# Patient Record
Sex: Male | Born: 1995 | Race: Black or African American | Hispanic: No | Marital: Single | Smoking: Never smoker
Health system: Southern US, Community
[De-identification: ages and names within clinical notes are randomized; demographics above are authoritative.]

---

## 2017-07-11 ENCOUNTER — Emergency Department
Admission: EM | Admit: 2017-07-11 | Discharge: 2017-07-11 | Disposition: A | Discharge status: Other Acute Inpatient Hospital | Payer: Federal, State, Local not specified - PPO | Attending: Emergency Medicine | Admitting: Emergency Medicine

## 2017-07-11 ENCOUNTER — Inpatient Hospital Stay
Admission: AD | Admit: 2017-07-11 | Discharge: 2017-07-22 | DRG: 885 | Disposition: A | Discharge status: Home / Self Care | Payer: Federal, State, Local not specified - PPO | Source: Intra-hospital | Attending: Psychiatry | Admitting: Psychiatry

## 2017-07-11 ENCOUNTER — Encounter: Payer: Self-pay | Admitting: Emergency Medicine

## 2017-07-11 ENCOUNTER — Other Ambulatory Visit: Payer: Self-pay

## 2017-07-11 DIAGNOSIS — G4700 Insomnia, unspecified: Secondary | ICD-10-CM | POA: Insufficient documentation

## 2017-07-11 DIAGNOSIS — F309 Manic episode, unspecified: Secondary | ICD-10-CM | POA: Diagnosis present

## 2017-07-11 DIAGNOSIS — M25531 Pain in right wrist: Secondary | ICD-10-CM | POA: Diagnosis not present

## 2017-07-11 DIAGNOSIS — T148XXA Other injury of unspecified body region, initial encounter: Secondary | ICD-10-CM

## 2017-07-11 DIAGNOSIS — F3112 Bipolar disorder, current episode manic without psychotic features, moderate: Secondary | ICD-10-CM

## 2017-07-11 LAB — COMPREHENSIVE METABOLIC PANEL
ALBUMIN: 4.8 g/dL (ref 3.5–5.0)
ALK PHOS: 94 U/L (ref 38–126)
ALT: 39 U/L (ref 17–63)
ANION GAP: 10 (ref 5–15)
AST: 31 U/L (ref 15–41)
BILIRUBIN TOTAL: 1.1 mg/dL (ref 0.3–1.2)
BUN: 17 mg/dL (ref 6–20)
CALCIUM: 9.7 mg/dL (ref 8.9–10.3)
CO2: 25 mmol/L (ref 22–32)
Chloride: 104 mmol/L (ref 101–111)
Creatinine, Ser: 1.15 mg/dL (ref 0.61–1.24)
Glucose, Bld: 103 mg/dL — ABNORMAL HIGH (ref 65–99)
POTASSIUM: 4 mmol/L (ref 3.5–5.1)
Sodium: 139 mmol/L (ref 135–145)
TOTAL PROTEIN: 8.9 g/dL — AB (ref 6.5–8.1)

## 2017-07-11 LAB — CBC
HCT: 45.3 % (ref 40.0–52.0)
HEMOGLOBIN: 15 g/dL (ref 13.0–18.0)
MCH: 30.1 pg (ref 26.0–34.0)
MCHC: 33.1 g/dL (ref 32.0–36.0)
MCV: 90.8 fL (ref 80.0–100.0)
PLATELETS: 352 10*3/uL (ref 150–440)
RBC: 4.99 MIL/uL (ref 4.40–5.90)
RDW: 13.1 % (ref 11.5–14.5)
WBC: 9.6 10*3/uL (ref 3.8–10.6)

## 2017-07-11 LAB — ETHANOL

## 2017-07-11 MED ORDER — MAGNESIUM HYDROXIDE 400 MG/5ML PO SUSP: 30.0000 mL | Freq: Every day | ORAL | Status: DC | PRN

## 2017-07-11 MED ORDER — LORAZEPAM 2 MG PO TABS: 2.0000 mg | ORAL_TABLET | ORAL | Status: DC | PRN

## 2017-07-11 MED ORDER — ACETAMINOPHEN 325 MG PO TABS
650.0000 mg | ORAL_TABLET | Freq: Four times a day (QID) | ORAL | Status: DC | PRN
  Filled 2017-07-11 (×3): qty 2

## 2017-07-11 MED ORDER — LORAZEPAM 2 MG PO TABS
2.0000 mg | ORAL_TABLET | ORAL | Status: DC | PRN
  Administered 2017-07-11: 2 mg via ORAL
  Filled 2017-07-11: qty 1

## 2017-07-11 MED ORDER — ALUM & MAG HYDROXIDE-SIMETH 200-200-20 MG/5ML PO SUSP: 30.0000 mL | ORAL | Status: DC | PRN

## 2017-07-11 MED ORDER — LORAZEPAM 2 MG PO TABS: 2.0000 mg | ORAL_TABLET | Freq: Once | ORAL | Status: DC

## 2017-07-11 NOTE — Progress Notes (Signed)
Admitted patient to the unit alert and oriented to self and environment denies any pain at this time. Patient appeared dilutional  of grandeur and  obsurd exaggeration of self,  states he will be a billionaire in a week , disconnect from reality sometimes, refused to sign treatment agreement stating that he can right his own agreement and refused to contract for  Safety. Patient is placed in his room and monitored by staff and care giver including safety officers noted.Room and body search done and no weapon found. Skin assessment conducted by two RNs Vanice SarahAbi and [place]. Noted.

## 2017-07-11 NOTE — ED Notes (Signed)
PT  VOL °

## 2017-07-11 NOTE — BH Assessment (Signed)
[facility] by Dr. Toni Amendlapacs.  Attending Physician will be Dr. Mikey CollegeMcKnew.   Patient has been assigned to room 306, by [facility] Charge Nurse Abi.   Intake Paper Work has been signed and placed on patient chart.  ER staff is aware of the admission (Dr. Pershing ProudSchaevitz, ER MD; Kimberlee NearingAndrea B., Patient's Nurse & Mertie ClauseJeanelle, Patient Access).

## 2017-07-11 NOTE — ED Notes (Signed)
Pt stated he wanted to wear his underwear on the outside of his outfit to make light of the situation. Pt turned shirt backwards and inside out as well. Asked if he could take stress ball with him. Nurse Amy and Dr Pershing ProudSchaevitz made aware.

## 2017-07-11 NOTE — ED Notes (Signed)
Pt resting on stretcher with lights out to enhance rest. Pt states he is getting very tired and doesn't sleep very often. Asking when he will be admitted downstairs so he can get some sleep.

## 2017-07-11 NOTE — ED Notes (Addendum)
Melvin Drake: Software engineercholarship Director at [employer] would like to be included in password to check on pt. His cell:  [phone]; Pt's cell phone, cross necklace, bookbag with laptop and toiletries sent home with [name]. Pt aware.

## 2017-07-11 NOTE — ED Notes (Signed)
Mom was called by pt prior to his arrival to ED and is aware he is here. She will be wanting an update from pts nurse as soon as possible per pts [name] mentor/scholarship director who dropped him off at ED.

## 2017-07-11 NOTE — ED Notes (Signed)
Unable to give urine specimen at this time .  

## 2017-07-11 NOTE — Tx Team (Signed)
Initial Treatment Plan 07/11/2017 10:45 PM Melvin Drake [id number]    PATIENT STRESSORS: Health problems Substance abuse   PATIENT STRENGTHS: Average or above average intelligence Capable of independent living Religious Affiliation   PATIENT IDENTIFIED PROBLEMS: Manic behavior    Lack of Sleep last three days    Drug use             DISCHARGE CRITERIA:  Ability to meet basic life and health needs Improved stabilization in mood, thinking, and/or behavior Verbal commitment to aftercare and medication compliance  PRELIMINARY DISCHARGE PLAN: Attend aftercare/continuing care group Outpatient therapy Return to previous work or school arrangements  PATIENT/FAMILY INVOLVEMENT: This treatment plan has been presented to and reviewed with the patient, Melvin Drake,   The patient  have been given the opportunity to ask questions and make suggestions.  Lelan PonsAlexander  Ashawnti Tangen, RN 07/11/2017, 10:45 PM

## 2017-07-11 NOTE — ED Notes (Signed)

## 2017-07-11 NOTE — ED Triage Notes (Signed)
Pt here with c/o manic behavior, talking a lot in triage, denies si/hi. States he has recently come out of a relationship and needs to be here.

## 2017-07-11 NOTE — ED Notes (Signed)
Meal provided 

## 2017-07-11 NOTE — ED Triage Notes (Signed)
Pt paranoid about us taking his phone, his counselor from [employer] states this is bizarre behavior for pt, he his well known at [employer] and a strong Consulting civil engineerstudent. Pt states his relationship with his girlfriend "messed him up," and now he is bisexual. Pt also states he is a strong black male and doesn't like being in the minority at school. States he just needed to get away from everything for a while and needs a mental health break.

## 2017-07-11 NOTE — ED Notes (Signed)
Mom: Melvin Drake Mom's cell [phone]; home phone [phone]

## 2017-07-11 NOTE — Consult Note (Signed)
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 Bipolar 1 disorder, manic, moderate ([place]) [F31.12] 07/11/2017    Total Time spent with patient: 1 hour  Subjective:   Melvin Drake is a 21 y.o. male patient admitted with "I know I am having manic feelings".  HPI: Patient interviewed chart reviewed.  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  He states that he has been having what he can identify as a manic episode that has been escalating over the last 3 weeks.  He says he has hardly slept for the last week and a half.  His thoughts are racing all the time.  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  Patient is aware that all of this is out of the ordinary for him.  He denies that he has been having hallucinations.  Denies suicidal or homicidal thoughts.  He is not on any medication and not currently receiving any treatment for mental health problems.  Major stresses include a breakup with a girlfriend early in this part of the semester and possibly more significantly that his father was recently arrested and put back into incarceration after having been free.  Medical history: No significant medical problems known.  Substance abuse history: Patient says that he does not drink alcohol does not use any kind of drugs.  No evidence so far that he does I do not think all of the tests are back yet.  Social history: Patient is from [place].  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  Apparently at baseline very well thought of.  He lists several extracurricular  activities he takes part in and he is a residence Actor.  Past Psychiatric History: Patient had a psychiatric hospitalization when he was a sophomore in high school 4 years ago.  Was diagnosed with bipolar disorder at that time and was treated with Risperdal.  Stayed on medicine for about 8 months before stopping it.  Has not been back on any psychiatric medicines since then.  No history of suicide attempts or violence.  Risk to Self: Suicidal Ideation: No Suicidal Intent: No Is patient at risk for suicide?: No Suicidal Plan?: No Access to Means: No What has been your use of drugs/alcohol within the last 12 months?: Reports of none Other Self Harm Risks: Reports of none Triggers for Past Attempts: None known Intentional Self Injurious Behavior: None Risk to Others: Homicidal Ideation: No Thoughts of Harm to Others: No Current Homicidal Intent: No Current Homicidal Plan: No Access to Homicidal Means: No Identified Victim: Reports of none History of harm to others?: No Assessment of Violence: None Noted Violent Behavior Description: Reports of none Does patient have access to weapons?: No Criminal Charges Pending?: No Does patient have a court date: No Prior Inpatient Therapy: Prior Inpatient Therapy: Yes Prior Therapy Dates: While he was in [employer] Prior Therapy Facilty/Provider(s): Hospital in [place], where he is from. Reason for Treatment: Bipolar, Manic Prior Outpatient Therapy: Prior Outpatient Therapy: Yes Prior Therapy Dates: Current Prior Therapy Facilty/Provider(s): [facility] Reason for Treatment: Unknown Does patient have an ACCT  team?: No Does patient have Intensive In-House Services?  : No Does patient have [facility] services? : No Does patient have P4CC services?: No  Past Medical History: No past medical history on file. History reviewed. No pertinent surgical history. Family History: No family history on  file. Family Psychiatric  History: Family history is positive for bipolar disorder and several people most notably his father. Social History:  Social History   Substance and Sexual Activity  Alcohol Use Yes   Comment: occas.     Social History   Substance and Sexual Activity  Drug Use Yes  . Types: Marijuana   Comment: not recently-for about 3 weeks now    Social History   Socioeconomic History  . Marital status: Single    Spouse name: None  . Number of children: None  . Years of education: None  . Highest education level: None  Social Needs  . Financial resource strain: None  . Food insecurity - worry: None  . Food insecurity - inability: None  . Transportation needs - medical: None  . Transportation needs - non-medical: None  Occupational History  . None  Tobacco Use  . Smoking status: Never Smoker  . Smokeless tobacco: Never Used  Substance and Sexual Activity  . Alcohol use: Yes    Comment: occas.  . Drug use: Yes    Types: Marijuana    Comment: not recently-for about 3 weeks now  . Sexual activity: None  Other Topics Concern  . None  Social History Narrative  . None   Additional Social History:    Allergies:  Allergies no known allergies  Labs:  Results for orders placed or performed during the hospital encounter of 07/11/17 (from the past 48 hour(s))  Comprehensive metabolic panel     Status: Abnormal   Collection Time: 07/11/17  5:02 PM  Result Value Ref Range   Sodium 139 135 - 145 mmol/L   Potassium 4.0 3.5 - 5.1 mmol/L   Chloride 104 101 - 111 mmol/L   CO2 25 22 - 32 mmol/L   Glucose, Bld 103 (H) 65 - 99 mg/dL   BUN 17 6 - 20 mg/dL   Creatinine, Ser 1.15 0.61 - 1.24 mg/dL   Calcium 9.7 8.9 - 10.3 mg/dL   Total Protein 8.9 (H) 6.5 - 8.1 g/dL   Albumin 4.8 3.5 - 5.0 g/dL   AST 31 15 - 41 U/L   ALT 39 17 - 63 U/L   Alkaline Phosphatase 94 38 - 126 U/L   Total Bilirubin 1.1 0.3 - 1.2 mg/dL   GFR calc non Af Amer >60 >60 mL/min   GFR calc  Af Amer >60 >60 mL/min    Comment: (NOTE) The eGFR has been calculated using the CKD EPI equation. This calculation has not been validated in all clinical situations. eGFR's persistently <60 mL/min signify possible Chronic Kidney Disease.    Anion gap 10 5 - 15  Ethanol     Status: None   Collection Time: 07/11/17  5:02 PM  Result Value Ref Range   Alcohol, Ethyl (B) <10 <10 mg/dL    Comment:        LOWEST DETECTABLE LIMIT FOR SERUM ALCOHOL IS 10 mg/dL FOR MEDICAL PURPOSES ONLY   cbc     Status: None   Collection Time: 07/11/17  5:02 PM  Result Value Ref Range   WBC 9.6 3.8 - 10.6 K/uL   RBC 4.99 4.40 - 5.90 MIL/uL   Hemoglobin 15.0 13.0 -  18.0 g/dL   HCT 45.3 40.0 - 52.0 %   MCV 90.8 80.0 - 100.0 fL   MCH 30.1 26.0 - 34.0 pg   MCHC 33.1 32.0 - 36.0 g/dL   RDW 13.1 11.5 - 14.5 %   Platelets 352 150 - 440 K/uL    Current Facility-Administered Medications  Medication Dose Route Frequency Provider Last Rate Last Dose  . LORazepam (ATIVAN) tablet 2 mg  2 mg Oral Once Orbie Pyo, MD       No current outpatient medications on file.    Musculoskeletal: Strength & Muscle Tone: within normal limits Gait & Station: normal Patient leans: N/A  Psychiatric Specialty Exam: Physical Exam  Nursing note and vitals reviewed. Constitutional: He appears well-developed and well-nourished.  HENT:  Head: Normocephalic and atraumatic.  Eyes: Conjunctivae are normal. Pupils are equal, round, and reactive to light.  Neck: Normal range of motion.  Cardiovascular: Regular rhythm and normal heart sounds.  Respiratory: Effort normal and breath sounds normal.  GI: Soft.  Musculoskeletal: Normal range of motion.  Neurological: He is alert.  Skin: Skin is warm and dry.  Psychiatric: His affect is labile. His speech is rapid and/or pressured and tangential. He is agitated and hyperactive. He is not aggressive and not combative. Thought content is not paranoid. Cognition and  memory are impaired. He expresses impulsivity. He expresses no homicidal and no suicidal ideation.    Review of Systems  Constitutional: Negative.   HENT: Negative.   Eyes: Negative.   Respiratory: Negative.   Cardiovascular: Negative.   Gastrointestinal: Negative.   Musculoskeletal: Negative.   Skin: Negative.   Neurological: Negative.   Psychiatric/Behavioral: Negative for depression, hallucinations, memory loss, substance abuse and suicidal ideas. The patient is nervous/anxious and has insomnia.     Pulse (!) 109, temperature 98.4 F (36.9 C), temperature source Oral, resp. rate 18, height 5' 7"  (1.702 m), weight 81.6 kg (180 lb), SpO2 98 %.Body mass index is 28.19 kg/m.  General Appearance: Fairly Groomed  Eye Contact:  Good  Speech:  Clear and Coherent  Volume:  Increased  Mood:  Euphoric  Affect:  Congruent  Thought Process:  Disorganized  Orientation:  Full (Time, Place, and Person)  Thought Content:  Illogical and Tangential  Suicidal Thoughts:  No  Homicidal Thoughts:  No  Memory:  Immediate;   Good Recent;   Fair Remote;   Fair  Judgement:  Fair  Insight:  Fair  Psychomotor Activity:  Increased and Restlessness  Concentration:  Concentration: Fair  Recall:  [employer] of Knowledge:  Fair  Language:  Fair  Akathisia:  No  Handed:  Right  AIMS (if indicated):     Assets:  Communication Skills Desire for Improvement Financial Resources/Insurance Housing Physical Health Resilience Social Support  ADL's:  Intact  Cognition:  WNL  Sleep:        Treatment Plan Summary: Daily contact with patient to assess and evaluate symptoms and progress in treatment, Medication management and Plan 21 year old man who clearly he is having a manic episode.  Not yet obviously psychotic but verging into it and getting more disorganized.  Not functioning well very poor self-care currently outside the hospital.  This is a condition that is potentially dangerous and requires  acute treatment in the hospital.  I had a talk with the patient explaining the importance of inpatient treatment.  He is agreeable to this.  It is very important to him that he maintain control over the situation and  so he says he will sign voluntarily rather than undergo commitment.  I am willing to go with that as long as he does not go back on his decision to sign in.  He also insists that he does not want to take any medicine.  I explained to him that just to start with he needs to get some sleep and so we really ought to give him something that will help him rest tonight.  He is tentatively agreeable to it.  Case reviewed with emergency room physician.  I will order EKG and full set of labs and get orders done for admission downstairs.  Disposition: Recommend psychiatric Inpatient admission when medically cleared. Supportive therapy provided about ongoing stressors.  Alethia Berthold, MD 07/11/2017 7:22 PM

## 2017-07-11 NOTE — BH Assessment (Signed)
Assessment Note  Melvin Drake is an 21 y.o. male who presents to the ER via advisor [facility] [employer]. Per the report of the advisor Melvin Drake(Melvin Drake), he has received multiple calls from other students about the recent changes in his behavior. The change occurred approximately a week ago. He has been manic, religious preoccupied and talking about being a prophet and special messenger for God. 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  During the interview, the patient was hyper-verbal and difficult to remain focus on one thing, in order to answer questions.  He shared he's been inpatient in the past but it's unclear when and for what. Per the report of [name], it was due to similar presentation and it was when he was in [employer]. Patient denies the use of any mind-altering substance. He denies the any involvement with the legal system and with DSS. Patient denies SI/HI and AV/H.  [facility]   Mother Melvin Drake(Melvin Drake [phone]).  Diagnosis: Bipolar  Past Medical History: No past medical history on file.  History reviewed. No pertinent surgical history.  Family History: No family history on file.  Social History:  reports that  has never smoked. he has never used smokeless tobacco. He reports that he drinks alcohol. He reports that he uses drugs. Drug: Marijuana.  Additional Social History:  Alcohol / Drug Use Pain Medications: See PTA Prescriptions: See PTA Over the Counter: See PTA History of alcohol / drug use?: No history of alcohol / drug abuse Longest period of sobriety (when/how long): Unknown  CIWA: CIWA-Ar Pulse Rate: (!) 109 COWS:    Allergies: Allergies no known allergies  Home Medications:  (Not in a hospital admission)  OB/GYN Status:  No LMP for male patient.  General Assessment Data Assessment unable to be  completed: Yes [facility] Assessment: In system Is this a Tele or Face-to-Face Assessment?: Face-to-Face Is this an Initial Assessment or a Re-assessment for this encounter?: Initial Assessment Marital status: Single Maiden name: n/a Is patient pregnant?: No Pregnancy Status: No Living Arrangements: Other (Comment)(Live on campus) Can pt return to current living arrangement?: Yes Admission Status: Voluntary Is patient capable of signing voluntary admission?: Yes Referral Source: Self/Family/Friend Insurance type: unknown  Medical Screening Exam [facility] Walk[facility]in ONLY) Medical Exam completed: Yes  Crisis Care Plan Living Arrangements: Other (Comment)(Live on campus) Legal Guardian: Other:(Self) Name of Psychiatrist: Reports of none [facility] Therapist[facility]  Education Status Is patient currently in school?: Yes Current Grade: Second Year in [employer] Highest grade of school patient has completed: [employer] Name of school: [employer] Contact person: n/a  Risk to self with the past 6 months Suicidal Ideation: No Has patient been a risk to self within the past 6 months prior to admission? : No Suicidal Intent: No Has patient had any suicidal intent within the past 6 months prior to admission? : No Is patient at risk for suicide?: No Suicidal Plan?: No Has patient had any suicidal plan within the past 6 months prior to admission? : No Access to Means: No What has been your use of drugs/alcohol within the last 12 months?: Reports of none Previous Attempts/Gestures: No Other Self Harm Risks: Reports of none Triggers for Past Attempts: None known Intentional Self Injurious Behavior: None Family Suicide History: No Recent stressful life event(s): Other (Comment)(Recent changes in mental state) Persecutory voices/beliefs?: No  Depression: Yes Depression Symptoms: Loss of interest in usual pleasures Substance abuse history and/or  treatment for substance abuse?: No Suicide prevention information given to non-admitted patients: Not applicable  Risk to Others within the past 6 months Homicidal Ideation: No Does patient have any lifetime risk of violence toward others beyond the six months prior to admission? : No Thoughts of Harm to Others: No Current Homicidal Intent: No Current Homicidal Plan: No Access to Homicidal Means: No Identified Victim: Reports of none History of harm to others?: No Assessment of Violence: None Noted Violent Behavior Description: Reports of none Does patient have access to weapons?: No Criminal Charges Pending?: No Does patient have a court date: No Is patient on probation?: No  Psychosis Hallucinations: None noted Delusions: Grandiose(Religious preoccupied)  Mental Status Report Appearance/Hygiene: Unremarkable, In scrubs Eye Contact: Good Motor Activity: Freedom of movement, Unremarkable Speech: Logical/coherent, Tangential, Rapid Level of Consciousness: Alert Mood: Preoccupied Affect: Appropriate to circumstance Anxiety Level: Minimal Thought Processes: Coherent, Relevant Judgement: Partial Orientation: Person, Place, Time, Situation, Appropriate for developmental age Obsessive Compulsive Thoughts/Behaviors: Minimal  Cognitive Functioning Concentration: Decreased Memory: Remote Intact, Recent Impaired IQ: Average Insight: Fair Impulse Control: Fair Appetite: Fair Weight Loss: 0 Weight Gain: 0 Sleep: Decreased Total Hours of Sleep: (unknown) Vegetative Symptoms: None  ADLScreening [facility] Assessment Services) Patient's cognitive ability adequate to safely complete daily activities?: Yes Patient able to express need for assistance with ADLs?: Yes Independently performs ADLs?: Yes (appropriate for developmental age)  Prior Inpatient Therapy Prior Inpatient Therapy: Yes Prior Therapy Dates: While he was in [employer] Prior Therapy Facilty/Provider(s): Hospital in  [place], where he is from. Reason for Treatment: Bipolar, Manic  Prior Outpatient Therapy Prior Outpatient Therapy: Yes Prior Therapy Dates: Current Prior Therapy Facilty/Provider(s): [name] [facility] Reason for Treatment: Unknown Does patient have an ACCT team?: No Does patient have Intensive In-House Services?  : No Does patient have [facility] services? : No Does patient have P4CC services?: No  ADL Screening (condition at time of admission) Patient's cognitive ability adequate to safely complete daily activities?: Yes Is the patient deaf or have difficulty hearing?: No Does the patient have difficulty seeing, even when wearing glasses/contacts?: No Does the patient have difficulty concentrating, remembering, or making decisions?: No Patient able to express need for assistance with ADLs?: Yes Does the patient have difficulty dressing or bathing?: No Independently performs ADLs?: Yes (appropriate for developmental age) Does the patient have difficulty walking or climbing stairs?: No Weakness of Legs: None Weakness of Arms/Hands: None  Home Assistive Devices/Equipment Home Assistive Devices/Equipment: None  Therapy Consults (therapy consults require a physician order) PT Evaluation Needed: No OT Evalulation Needed: No SLP Evaluation Needed: No   Values / Beliefs Cultural Requests During Hospitalization: None Spiritual Requests During Hospitalization: None Consults Spiritual Care Consult Needed: No Social Work Consult Needed: No Merchant navy officer (For Healthcare) Does Patient Have a Medical Advance Directive?: No    Additional Information 1:1 In Past 12 Months?: No CIRT Risk: No Elopement Risk: No Does patient have medical clearance?: Yes  Child/Adolescent Assessment Running Away Risk: Denies(Patient is an adult)  Disposition:  Disposition Initial Assessment Completed for this Encounter: Yes Disposition of Patient:  Pending Review with psychiatrist  On Site Evaluation by:   Reviewed with Physician:    Lilyan Gilford MS, LCAS, LPC, NCC, CCSI Therapeutic Triage Specialist 07/11/2017 6:22 PM

## 2017-07-11 NOTE — ED Provider Notes (Addendum)
[facility] Emergency Department Provider Note  ____________________________________________   First MD Initiated Contact with Patient 07/11/17 1806     (approximate)  I have reviewed the triage vital signs and the nursing notes.   HISTORY  Chief Complaint Manic Behavior   HPI Melvin Drake is a 21 y.o. male with a history of mania who is presenting to the emergency department with rapid speech and only sleeping several hours a night over the past 3 nights.  He says that his friends were concerned about him and that is why he was brought into the emergency department today.  He is denying any suicidal ideation.    No past medical history on file.  There are no active problems to display for this patient.   History reviewed. No pertinent surgical history.  Prior to Admission medications   Not on File    Allergies Patient has no known allergies.  No family history on file.  Social History Social History   Tobacco Use  . Smoking status: Never Smoker  . Smokeless tobacco: Never Used  Substance Use Topics  . Alcohol use: Yes    Comment: occas.  . Drug use: Yes    Types: Marijuana    Comment: not recently-for about 3 weeks now    Review of Systems  Constitutional: No fever/chills Eyes: No visual changes. ENT: No sore throat. Cardiovascular: Denies chest pain. Respiratory: Denies shortness of breath. Gastrointestinal: No abdominal pain.  No nausea, no vomiting.  No diarrhea.  No constipation. Genitourinary: Negative for dysuria. Musculoskeletal: Negative for back pain. Skin: Negative for rash. Neurological: Negative for headaches, focal weakness or numbness.   ____________________________________________   PHYSICAL EXAM:  VITAL SIGNS: ED Triage Vitals  Enc Vitals Group     BP --      Pulse Rate 07/11/17 1653 (!) 109     Resp 07/11/17 1653 18     Temp 07/11/17 1653 98.4 F (36.9 C)     Temp Source 07/11/17 1653 Oral     SpO2  07/11/17 1653 98 %     Weight 07/11/17 1653 180 lb (81.6 kg)     Height 07/11/17 1653 5\' 7"  (1.702 m)     Head Circumference --      Peak Flow --      Pain Score 07/11/17 1654 10     Pain Loc --      Pain Edu? --      Excl. in GC? --     Constitutional: Alert and oriented. Well appearing and in no acute distress. Eyes: Conjunctivae are normal.  Head: Atraumatic. Nose: No congestion/rhinnorhea. Mouth/Throat: Mucous membranes are moist.  Neck: No stridor.   Cardiovascular: Normal rate, regular rhythm. Grossly normal heart sounds.  Respiratory: Normal respiratory effort.  No retractions. Lungs CTAB. Gastrointestinal: Soft and nontender. No distention.  Musculoskeletal: No lower extremity tenderness nor edema.  No joint effusions. Neurologic:  Normal speech and language. No gross focal neurologic deficits are appreciated. Skin:  Skin is warm, dry and intact. No rash noted. Psychiatric: Pressured speech  ____________________________________________   LABS (all labs ordered are listed, but only abnormal results are displayed)  Labs Reviewed  COMPREHENSIVE METABOLIC PANEL - Abnormal; Notable for the following components:      Result Value   Glucose, Bld 103 (*)    Total Protein 8.9 (*)    All other components within normal limits  ETHANOL  CBC  URINE DRUG SCREEN, QUALITATIVE ([facility] ONLY)   ____________________________________________  EKG   ____________________________________________  RADIOLOGY   ____________________________________________   PROCEDURES  Procedure(s) performed:   Procedures  Critical Care performed:   ____________________________________________   INITIAL IMPRESSION / ASSESSMENT AND PLAN / ED COURSE  Pertinent labs & imaging results that were available during my care of the patient were reviewed by me and considered in my medical decision making (see chart for details).  DDX: Mania, schizophrenia, substance  abuse  ----------------------------------------- 7:07 PM on 07/11/2017 -----------------------------------------  Patient seen by Dr. Toni Amendlapacs who is recommending admission to the hospital under voluntary status.  Patient says that he feels like he has to get up and move around the room.  I discussed medication with Dr. Evalee Muttonflavectomy agreed on 2 mg of Ativan.      ____________________________________________   FINAL CLINICAL IMPRESSION(S) / ED DIAGNOSES  Final diagnoses:  Mania (HCC)      NEW MEDICATIONS STARTED DURING THIS VISIT:  This SmartLink is deprecated. Use AVSMEDLIST instead to display the medication list for a patient.   Note:  This document was prepared using Dragon voice recognition software and may include unintentional dictation errors.     Myrna BlazerSchaevitz, David Matthew, MD 07/11/17 1908  ED ECG REPORT I, Arelia LongestSchaevitz,  David M, the attending physician, personally viewed and interpreted this ECG.   Date: 07/11/2017  EKG Time: 2012  Rate: 90  Rhythm: normal sinus rhythm  Axis: Normal  Intervals:none  ST&T Change: T wave inversions in 2 3 and aVF.  No ST elevations or depressions.     Myrna BlazerSchaevitz, David Matthew, MD 07/11/17 2029

## 2017-07-11 NOTE — ED Notes (Signed)
Pt talking on phone in triage to a fellow student at [employer]. Pt telling friend he will have access to his phone.
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
## 2017-07-12 DIAGNOSIS — F3112 Bipolar disorder, current episode manic without psychotic features, moderate: Principal | ICD-10-CM

## 2017-07-12 LAB — LIPID PANEL
Cholesterol: 132 mg/dL (ref 0–200)
HDL: 54 mg/dL (ref >40)
LDL CALC: 70 mg/dL (ref 0–99)
Total CHOL/HDL Ratio: 2.4 RATIO
Triglycerides: 40 mg/dL (ref <150)
VLDL: 8 mg/dL (ref 0–40)

## 2017-07-12 LAB — HEMOGLOBIN A1C
HEMOGLOBIN A1C: 5.4 % (ref 4.8–5.6)
MEAN PLASMA GLUCOSE: 108.28 mg/dL

## 2017-07-12 LAB — TSH: TSH: 0.937 u[IU]/mL (ref 0.350–4.500)

## 2017-07-12 MED ORDER — ARIPIPRAZOLE 10 MG PO TABS
15.0000 mg | ORAL_TABLET | Freq: Every day | ORAL | Status: DC
  Administered 2017-07-12 – 2017-07-14 (×3): 15 mg via ORAL
  Filled 2017-07-12 (×3): qty 1

## 2017-07-12 MED ORDER — LORAZEPAM 2 MG PO TABS: 2.0000 mg | ORAL_TABLET | Freq: Every day | ORAL | Status: DC

## 2017-07-12 MED ORDER — LORAZEPAM 1 MG PO TABS
1.0000 mg | ORAL_TABLET | ORAL | Status: DC | PRN
  Administered 2017-07-13 – 2017-07-17 (×3): 1 mg via ORAL
  Filled 2017-07-12 (×3): qty 1

## 2017-07-12 MED ORDER — ACETAMINOPHEN 325 MG PO TABS
650.0000 mg | ORAL_TABLET | Freq: Four times a day (QID) | ORAL | Status: DC | PRN
  Administered 2017-07-12 – 2017-07-13 (×3): 650 mg via ORAL
  Filled 2017-07-12: qty 2

## 2017-07-12 MED ORDER — TRAZODONE HCL 100 MG PO TABS
100.0000 mg | ORAL_TABLET | Freq: Every day | ORAL | Status: DC
  Administered 2017-07-12: 100 mg via ORAL
  Filled 2017-07-12: qty 1

## 2017-07-12 NOTE — Progress Notes (Signed)
Recreation Therapy Notes  Date: 11.06.18  Time: 3:00pm  Location: Craft room  Behavioral response: Appropriate  Group Type: Art/Craft  Participation level: Active  Communication: Patient was social with peers and staff.  Comments: Patient left group for unknown reason and never returned.  Katty Fretwell LRT/CTRS        Treston Coker 07/12/2017 4:33 PM

## 2017-07-12 NOTE — [facility] Group Notes (Signed)
LCSW Group Therapy Note 07/12/2017 9:00am  Type of Therapy and Topic:  Group Therapy:  Setting Goals  Participation Level:  Active  Description of Group: In this process group, patients discussed using strengths to work toward goals and address challenges.  Patients identified two positive things about themselves and one goal they were working on.  Patients were given the opportunity to share openly and support each other's plan for self-empowerment.  The group discussed the value of gratitude and were encouraged to have a daily reflection of positive characteristics or circumstances.  Patients were encouraged to identify a plan to utilize their strengths to work on current challenges and goals.  Therapeutic Goals 1. Patient will verbalize personal strengths/positive qualities and relate how these can assist with achieving desired personal goals 2. Patients will verbalize affirmation of peers plans for personal change and goal setting 3. Patients will explore the value of gratitude and positive focus as related to successful achievement of goals 4. Patients will verbalize a plan for regular reinforcement of personal positive qualities and circumstances.  Summary of Patient Progress:  Pt presents manic, requiring redirection.  Grandiose, offering to run group as he shared he has "always" met his goals.  Asking CSW to write his goal exactly as he verbalized it.  His goal was "to be discharged so I can vote before the polls close, in order to accomplish this I will speak with my doctor.   Therapeutic Modalities Cognitive Behavioral Therapy Motivational Interviewing   August Saucer, LCSW 07/12/2017 11:07 AM

## 2017-07-12 NOTE — Progress Notes (Signed)
Recreation Therapy Notes  Date: 11.06.18  Time: 9:30 am   Location: Craft Room  Behavioral response: Appropriate  Intervention Topic: Anger Management  Discussion/Intervention: Group content on today was focused on anger management. The group defined anger and reasons they become angry. Individuals expressed negative way they have dealt with anger in the past. Patients stated some positive ways they could deal with anger in the future. The group described how anger can affect your health and daily plans. Individuals participated in the intervention "Score your anger" where they had a chance to answer questions about themselves and get a score of their anger.  Patient came to group late for unknown reasons. While in group patient was redirected by staff. Individual participated in the intervention but was pulled from group by another discipline and never returned.   [name] LRT/CTRS        Melvin Drake 07/12/2017 11:37 AM

## 2017-07-12 NOTE — [facility] Group Notes (Signed)
[facility] LCSW Group Therapy Note  Date/Time: 07/12/17, 1400  Type of Therapy/Topic:  Group Therapy:  Feelings about Diagnosis  Participation Level:  Active   Mood:oppositional   Description of Group:    This group will allow patients to explore their thoughts and feelings about diagnoses they have received. Patients will be guided to explore their level of understanding and acceptance of these diagnoses. Facilitator will encourage patients to process their thoughts and feelings about the reactions of others to their diagnosis, and will guide patients in identifying ways to discuss their diagnosis with significant others in their lives. This group will be process-oriented, with patients participating in exploration of their own experiences as well as giving and receiving support and challenge from other group members.   Therapeutic Goals: 1. Patient will demonstrate understanding of diagnosis as evidence by identifying two or more symptoms of the disorder:  2. Patient will be able to express two feelings regarding the diagnosis 3. Patient will demonstrate ability to communicate their needs through discussion and/or role plays  Summary of Patient Progress: PT had difficulty sitting still and came into and left the group room three times.  Pt made multiple attempts to shift the topic of conversation to other subjects related to race and asked CSW if I was aware that I am the beneficiary of white privilege.  Pt did identify that his diagnoses was bipolar disorder but was unable to contribute to the group discussion.        Therapeutic Modalities:   Cognitive Behavioral Therapy Brief Therapy Feelings Identification   Melvin SquibbGreg Alene Bergerson, LCSW
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
## 2017-07-12 NOTE — [facility] Group Notes (Signed)
[facility] Group Notes:  (Nursing/MHT/Case Management/Adjunct)  Date:  07/12/2017  Time:  2:10 PM  Type of Therapy:  Psychoeducational Skills  Participation Level:  Did Not Attend    [name] Ellison 07/12/2017, 2:10 PM

## 2017-07-12 NOTE — Progress Notes (Signed)
Recreation Therapy Notes  INPATIENT RECREATION THERAPY ASSESSMENT  Patient Details Name: Melvin Drake MRN: [id number] DOB: Feb 21, 1996 Today's Date: 07/12/2017  Patient Stressors: Friends, Work  Coping Skills:   Exercise, Art/Dance, Music  Personal Challenges: Anger- Because I am a Black Man in [employer] Performance- Because I do not go to class Stress Management-Internal stress Trusting Others Work International aid/development workererformance- Because of rules  Leisure Interests (2+):  Individual - Reading, Social - Friends, Garment/textile technologistCommunity - Other (Comment)(Coffee shops)  Biochemist, clinicalAwareness of Community Resources:  Yes  Community Resources:  [employer], Engineer, petroleumGym  Current Use: Yes  If no, Barriers?: None Patient Strengths:  Highly educated, Defensive in the best possible way, Kind, Compassion, Honest- blunt  Patient Identified Areas of Improvement:  Take care of my mental and physical health  Current Recreation Participation:  None  Patient Goal for Hospitalization:  To be discharged tonight or tomorrow  [place]  County of Residence:  [place]   Current SI (including self-harm):  No  Current HI:  No  Consent to Intern Participation: N/A   Neesa Knapik 07/12/2017, 4:52 PM

## 2017-07-12 NOTE — Plan of Care (Signed)
Patient states "there is evil sprit here I can feel that."Patient is hyper verbal and argumentative at times.

## 2017-07-12 NOTE — [facility] Group Notes (Signed)
[facility] Group Notes:  (Nursing/MHT/Case Management/Adjunct)  Date:  07/12/2017  Time:  11:34 PM  Type of Therapy:  Group Therapy  Participation Level:  Did Not Attend  Participation Quality: Summary of Progress/Problems:  Mayra NeerJackie L Dj Senteno 07/12/2017, 11:34 PM

## 2017-07-12 NOTE — Progress Notes (Signed)
Patient is hyper verbal and intrusive at times.Patient refuses to go to groups states "I will be more anxious in the group."Compliant with medications.Appetite and energy level good.Support and encouragement given.

## 2017-07-13 ENCOUNTER — Inpatient Hospital Stay: Payer: Federal, State, Local not specified - PPO

## 2017-07-13 MED ORDER — HALOPERIDOL LACTATE 5 MG/ML IJ SOLN: 10.0000 mg | Freq: Three times a day (TID) | INTRAMUSCULAR | Status: DC | PRN

## 2017-07-13 MED ORDER — HALOPERIDOL 5 MG PO TABS
10.0000 mg | ORAL_TABLET | Freq: Three times a day (TID) | ORAL | Status: DC | PRN
  Administered 2017-07-13: 10 mg via ORAL
  Filled 2017-07-13: qty 2

## 2017-07-13 MED ORDER — DIPHENHYDRAMINE HCL 25 MG PO CAPS
50.0000 mg | ORAL_CAPSULE | Freq: Four times a day (QID) | ORAL | Status: DC | PRN
  Administered 2017-07-13: 50 mg via ORAL
  Filled 2017-07-13: qty 2

## 2017-07-13 MED ORDER — DIPHENHYDRAMINE HCL 50 MG/ML IJ SOLN: 50.0000 mg | Freq: Four times a day (QID) | INTRAMUSCULAR | Status: DC | PRN

## 2017-07-13 MED ORDER — LORAZEPAM 2 MG/ML IJ SOLN: 2.0000 mg | Freq: Four times a day (QID) | INTRAMUSCULAR | Status: DC | PRN

## 2017-07-13 MED ORDER — TRAZODONE HCL 100 MG PO TABS: 100.0000 mg | ORAL_TABLET | Freq: Every day | ORAL | Status: DC

## 2017-07-13 MED ORDER — LORAZEPAM 2 MG PO TABS
2.0000 mg | ORAL_TABLET | Freq: Four times a day (QID) | ORAL | Status: DC | PRN
  Administered 2017-07-14: 2 mg via ORAL
  Filled 2017-07-13: qty 1

## 2017-07-13 MED ORDER — TRAZODONE HCL 50 MG PO TABS: 50.0000 mg | ORAL_TABLET | Freq: Every day | ORAL | Status: DC

## 2017-07-13 MED ORDER — TRAZODONE HCL 50 MG PO TABS
50.0000 mg | ORAL_TABLET | Freq: Every day | ORAL | Status: DC
  Administered 2017-07-13: 50 mg via ORAL
  Filled 2017-07-13: qty 1

## 2017-07-13 NOTE — Tx Team (Signed)
Interdisciplinary Treatment and Diagnostic Plan Update  07/13/2017 Time of Session: 11am Annamary Carolinoah Erpelding MRN: [id number]  Principal Diagnosis: Bipolar 1 disorder, manic, moderate (HCC)  Secondary Diagnoses: Principal Problem:   Bipolar 1 disorder, manic, moderate (HCC)   Current Medications:  Current Facility-Administered Medications  Medication Dose Route Frequency Provider Last Rate Last Dose  . acetaminophen (TYLENOL) tablet 650 mg  650 mg Oral Q6H PRN Clapacs, John T, MD      . acetaminophen (TYLENOL) tablet 650 mg  650 mg Oral Q6H PRN McNew, Ileene HutchinsonHolly R, MD   650 mg at 07/13/17 1010  . alum & mag hydroxide-simeth (MAALOX/MYLANTA) 200-200-20 MG/5ML suspension 30 mL  30 mL Oral Q4H PRN Clapacs, John T, MD      . ARIPiprazole (ABILIFY) tablet 15 mg  15 mg Oral Daily McNew, Ileene HutchinsonHolly R, MD   15 mg at 07/13/17 0811  . LORazepam (ATIVAN) tablet 1 mg  1 mg Oral Q4H PRN McNew, Holly R, MD      . magnesium hydroxide (MILK OF MAGNESIA) suspension 30 mL  30 mL Oral Daily PRN Clapacs, John T, MD      . traZODone (DESYREL) tablet 50 mg  50 mg Oral QHS McNew, Ileene HutchinsonHolly R, MD       PTA Medications: No medications prior to admission.    Patient Stressors: Health problems Substance abuse  Patient Strengths: Average or above average intelligence Capable of independent living Religious Affiliation  Treatment Modalities: Medication Management, Group therapy, Case management,  1 to 1 session with clinician, Psychoeducation, Recreational therapy.   Physician Treatment Plan for Primary Diagnosis: Bipolar 1 disorder, manic, moderate (HCC) Long Term Goal(s): Improvement in symptoms so as ready for discharge Improvement in symptoms so as ready for discharge   Short Term Goals: Ability to identify changes in lifestyle to reduce recurrence of condition will improve Ability to verbalize feelings will improve Ability to demonstrate self-control will improve Ability to demonstrate self-control will improve Ability  to identify and develop effective coping behaviors will improve Compliance with prescribed medications will improve  Medication Management: Evaluate patient's response, side effects, and tolerance of medication regimen.  Therapeutic Interventions: 1 to 1 sessions, Unit Group sessions and Medication administration.  Evaluation of Outcomes: Progressing  Physician Treatment Plan for Secondary Diagnosis: Principal Problem:   Bipolar 1 disorder, manic, moderate (HCC)  Long Term Goal(s): Improvement in symptoms so as ready for discharge Improvement in symptoms so as ready for discharge   Short Term Goals: Ability to identify changes in lifestyle to reduce recurrence of condition will improve Ability to verbalize feelings will improve Ability to demonstrate self-control will improve Ability to demonstrate self-control will improve Ability to identify and develop effective coping behaviors will improve Compliance with prescribed medications will improve     Medication Management: Evaluate patient's response, side effects, and tolerance of medication regimen.  Therapeutic Interventions: 1 to 1 sessions, Unit Group sessions and Medication administration.  Evaluation of Outcomes: Progressing   RN Treatment Plan for Primary Diagnosis: Bipolar 1 disorder, manic, moderate (HCC) Long Term Goal(s): Knowledge of disease and therapeutic regimen to maintain health will improve  Short Term Goals: Ability to participate in decision making will improve, Ability to identify and develop effective coping behaviors will improve and Compliance with prescribed medications will improve  Medication Management: RN will administer medications as ordered by provider, will assess and evaluate patient's response and provide education to patient for prescribed medication. RN will report any adverse and/or side effects to prescribing provider.  Therapeutic Interventions: 1 on 1 counseling sessions, Psychoeducation,  Medication administration, Evaluate responses to treatment, Monitor vital signs and CBGs as ordered, Perform/monitor CIWA, COWS, AIMS and Fall Risk screenings as ordered, Perform wound care treatments as ordered.  Evaluation of Outcomes: Progressing   LCSW Treatment Plan for Primary Diagnosis: Bipolar 1 disorder, manic, moderate (HCC) Long Term Goal(s): Safe transition to appropriate next level of care at discharge, Engage patient in therapeutic group addressing interpersonal concerns.  Short Term Goals: Engage patient in aftercare planning with referrals and resources, Increase emotional regulation, Facilitate acceptance of mental health diagnosis and concerns, Identify triggers associated with mental health/substance abuse issues and Increase skills for wellness and recovery  Therapeutic Interventions: Assess for all discharge needs, 1 to 1 time with Social worker, Explore available resources and support systems, Assess for adequacy in community support network, Educate family and significant other(s) on suicide prevention, Complete Psychosocial Assessment, Interpersonal group therapy.  Evaluation of Outcomes: Progressing   Progress in Treatment: Attending groups: Yes. Participating in groups: Yes. Taking medication as prescribed: Yes. Toleration medication: Yes. Family/Significant other contact made: No, will contact:   mother Patient understands diagnosis: Yes. Discussing patient identified problems/goals with staff: Yes. Medical problems stabilized or resolved: Yes. Denies suicidal/homicidal ideation: Yes. Issues/concerns per patient self-inventory: No. Other:    New problem(s) identified: No, Describe:     New Short Term/Long Term Goal(s): to trust the process more  Discharge Plan or Barriers:  Discharge back to school and follow up at school counseling center.  Reason for Continuation of Hospitalization: Anxiety Mania Medication stabilization Other; describe Coordination  of Aftercare  Estimated Length of Stay: 5-7 days  Attendees: Patient:Melvin Drake 07/13/2017 4:18 PM  Physician: Corinna GabHolly McNew, MD 07/13/2017 4:18 PM  Nursing: Leonia ReaderPhyllis Cobb, RN 07/13/2017 4:18 PM  RN Care Manager: 07/13/2017 4:18 PM  Social Worker: Jake SharkSara Brenetta Penny, LCSW 07/13/2017 4:18 PM  Recreational Therapist: Garret ReddishShay Outlaw, LRT 07/13/2017 4:18 PM  Other:  07/13/2017 4:18 PM  Other:  07/13/2017 4:18 PM  Other: 07/13/2017 4:18 PM    Scribe for Treatment Team: [name] Leigh Blas, LCSW 07/13/2017 4:18 PM

## 2017-07-13 NOTE — Progress Notes (Signed)
Patient ID: Melvin Drake, male   DOB: December 28, 1995, 21 y.o.   MRN: [id number]   Spoke with pt's mother, Melvin Drake for collateral. She states that pt had an episode of psychosis at age 21. She states that he was very paranoid and fearful of others around him. He was so paranoid that he would not eat. He was hospitalized at that time but she is not sure of his diagnosis. She states that he stopped medications and did very well until now. She states that his brother came to [place] to visit him last weekend. When his brother got back to [place], he let his mother know that Melvin Riedeloah wasn't doing well and something was off. He states that he was more belligerent, aggressive, and using profanity. This is highly unusual for him as pt is extremely respectful usually. His mother also states that pt was talking to her about a being a billionaire and talking also and she had a hard time following him. She states that he was talking about being a prophet and a messenger from God. She had many questions about his medications and diagnosis that were answered. She plans to come on Saturday to visit him just for the weekend. We discussed that patient is on Abilify and discussed potential for the LAI. She feels that may be a good idea but is concerned he may not be able to go to clinic monthly to get the shot due to transportation. She states that she has a cousin with a diagnosis of bipolar disorder. She states that patient is currently on her insurance but he was talking about wanting to get his own insurance.

## 2017-07-13 NOTE — Progress Notes (Signed)
Patient ID: Melvin Drake, male   DOB: 06-17-1996, 21 y.o.   MRN: [id number] A&Ox3, denied pain, euphoric: dancing moon-walk, hyperactive, intrusive, hyper verbal, argumentative, grandiosity, inflated self-esteem, poor insight "God made marijuana, it is for a good purpose, it allows me to relax .Marland Kitchen..." Denied SI/HI, denied AV/H, complied with medication at bedtime.

## 2017-07-13 NOTE — Progress Notes (Signed)
Recreation Therapy Notes  Date: 11.07.18  Time: 9:30 am   Location: Craft Room  Behavioral response: Appropriate  Intervention Topic: Goals  Discussion/Intervention: Group content on today was focused on goals. Patients described what goals are and how they define goals. Individuals expressed how they go about setting goals and reaching them. The group identified how important goals are and if they make short term goals to reach long term goals. Patients described how many goals they work on at a time and what affects them not reaching their goal. Individuals described how much time they put into planning and obtaining their goals. The group participated in the intervention "My Goal Board" and made personal goal boards to help them achieve their goal. Clinical Observations/Feedback:  Patient came to group and stated that when making goals they have to be S.M.A.R.T goals. Individual expressed that he has been making goals since he was in high school and is now working on those goals. He was pulled from group by the Doctor but later returned. Patient came back to group and stated his head was hurting then left group, patient never returned to group.    Gracianna Vink LRT/CTRS         Magally Vahle 07/13/2017 10:37 AM

## 2017-07-13 NOTE — Plan of Care (Signed)
Patient is intrusive and in other peoples conversation. He is pleasant and easily re directed. He is disorganized and thoughts are all over the place. He stated the reason he is here is cause he built a million dollar building in 3 days.

## 2017-07-13 NOTE — [facility] Suicide Risk Assessment (Signed)
[facility] INPATIENT:  Family/Significant Other Suicide Prevention Education  Suicide Prevention Education:  Contact Attempts: Molli PoseyGeraldine Schlicker. Mother, [phone], (name of family member/significant other) has been identified by the patient as the family member/significant other with whom the patient will be residing, and identified as the person(s) who will aid the patient in the event of a mental health crisis.  With written consent from the patient, two attempts were made to provide suicide prevention education, prior to and/or following the patient's discharge.  We were unsuccessful in providing suicide prevention education.  A suicide education pamphlet was given to the patient to share with family/significant other.  Date and time of first attempt:07/13/2017-4:24pm Date and time of second attempt:  [name] 07/13/2017, 4:23 PM

## 2017-07-13 NOTE — [facility] Counselor (Signed)
Adult Comprehensive Assessment  Patient ID: Melvin Drake, male   DOB: 1996/03/13, 21 y.o.   MRN: [id number]  Information Source: Information source: Patient  Current Stressors:  Educational / Learning stressors: away from family at college Family Relationships: stressful as father recently "incarcerated again" Housing / Lack of housing: lives in campus housing Bereavement / Loss: family friend recently passed away  Living/Environment/Situation:  Living Arrangements: Alone What is atmosphere in current home: Comfortable  Family History:  Marital status: Single(has girlfriend of a year or so) Are you sexually active?: Yes What is your sexual orientation?: heterosexual Has your sexual activity been affected by drugs, alcohol, medication, or emotional stress?: no Does patient have children?: No  Childhood History:  By whom was/is the patient raised?: Both parents Patient's description of current relationship with people who raised him/her: okay-"dad is addicted to heroin and incarcerated again" Does patient have siblings?: Yes Number of Siblings: 3 Description of patient's current relationship with siblings: 2 older brothers and 1 younger sister Did patient suffer any verbal/emotional/physical/sexual abuse as a child?: No Did patient suffer from severe childhood neglect?: No Has patient ever been sexually abused/assaulted/raped as an adolescent or adult?: No Was the patient ever a victim of a crime or a disaster?: No Witnessed domestic violence?: No Has patient been effected by domestic violence as an adult?: No  Education:  Highest grade of school patient has completed: College-currently a sophmore Currently a student?: Yes Name of school: [employer] How long has the patient attended?: Sophmore Learning disability?: No  Employment/Work Situation:   Employment situation: Surveyor, mineralstudent Patient's job has been impacted by current illness: Yes Describe how patient's job has been  impacted: missing school for treatment, unable to focus Has patient ever been in the [employer] and Companymilitary?: No Has patient ever served in combat?: No Did You Receive Any Psychiatric Treatment/Services While in [employer] traderthe Military?: No Are There Guns or Other Weapons in Your Home?: No  Financial Resources:   Surveyor, quantityinancial resources: Support from parents / caregiver Does patient have a Lawyerrepresentative payee or guardian?: No  Alcohol/Substance Abuse:   What has been your use of drugs/alcohol within the last 12 months?: Reports no drug use If attempted suicide, did drugs/alcohol play a role in this?: No Alcohol/Substance Abuse Treatment Hx: Denies past history Has alcohol/substance abuse ever caused legal problems?: No  Social Support System:   Conservation officer, natureatient's Community Support System: Fair Museum/gallery exhibitions officerDescribe Community Support System: Friends, girlfriend, and family [place] in [place] Type of faith/religion: Pt states he depends on God to help him meet his goals.  Leisure/Recreation:   Leisure and Hobbies: enjoys reading, hanging out with friends.  Strengths/Needs:   What things does the patient do well?: able to advocate for himself, has goals that he is working towards In what areas does patient struggle / problems for patient: worries about his family, managing bipolar disorder and ADHD  Discharge Plan:   Does patient have access to transportation?: Yes Will patient be returning to same living situation after discharge?: Yes Currently receiving community mental health services: No If no, would patient like referral for services when discharged?: ([facility]) Does patient have financial barriers related to discharge medications?: No  Summary/Recommendations:   Summary and Recommendations (to be completed by the evaluator): Pt is 21 yo male who comes to the ED voluntarily after friends and family pointed out to him that he was manic. He says there is a history of Bipolar  Disorder and ADHD and that he  was hospitalized once while in High school for similar episode.  Reccomendations include crisis stabilization, Medication management, participation in groups and therapeutic milieu and finally follow up with appropriate outpatient providers as agreed upon at discharge.  Cleda DaubSara P Kemaria Dedic.LCSW 07/12/2017

## 2017-07-13 NOTE — Plan of Care (Signed)
Continue to provide encouragement and clinical support

## 2017-07-13 NOTE — Progress Notes (Signed)
Recreation Therapy Notes  Date: 11.07.18  Time: 3:00pm  Location: Outside  Behavioral response: Hyperactive  Group Type: Leisure  Participation level: Active  Communication: Patient was redirected by staff several times to keep his hand out of his pants. Patient was social with peers.  Comments: Patient came to group late for unknown reasons as well as left early.   Etheline Geppert LRT/CTRS        Ibraheem Voris 07/13/2017 4:05 PM

## 2017-07-13 NOTE — Progress Notes (Signed)
Patient was sleeping. [facility] will try at a later time.     07/13/17 1200  Clinical Encounter Type  Visited With Patient not available;Health care provider  Visit Type Initial;Spiritual support;Behavioral Health  Referral From Nurse

## 2017-07-13 NOTE — Progress Notes (Addendum)
[facility]  07/13/2017 10:08 AM Annamary Carolinoah Kallstrom  MRN:  [id number]   Subjective:  Pt states that he is "doing great." He continues to exhibit manic symptoms. Per RN notes, he continues to be euphoric, moon walking, dancing, intrusive, hyperactive, and grandiose on the unit. He was speaking of evil spirits on the unit. Upon evaluation, pt appears to have very slight improvement in pressured speech. He is still very tangential and grandiose. He has flight of ideas and frequently redirects the conversation to racial subjects. He states that he wants to leave the unit during the day and return to leave. He is not able to accept that is not possible. He states, "But you can. 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 He states that she "is going to sue you because she thinks you are trying to hurt me and she is only going to see you as a white woman. She will spaz out and sue you." Pt denies SI or any thoughts of self harm when asked. He denies AH, VH. He has been medication compliant and states, "I think Abilify is great. I want to keep taking it." He states that it "helps me stay myself." He states that he will continue to take it when he discharges "Until I can research some [place] mediation techniques to help." Pt is labile and begins crying at the end of the interview.  Principal Problem: Bipolar 1 disorder, manic, moderate (HCC) Diagnosis:   Patient Active Problem List   Diagnosis Date Noted  . Bipolar 1 disorder, manic, moderate (HCC) [F31.12] 07/11/2017   Total Time spent with patient: 20 minutes  Past Psychiatric History: See H&P  Past Medical History: History reviewed. No pertinent past medical history. History reviewed. No pertinent surgical history. Family History: History reviewed. No pertinent family history. Family Psychiatric   History: See H&P Social History:  Social History   Substance and Sexual Activity  Alcohol Use Yes   Comment: occas.     Social History   Substance and Sexual Activity  Drug Use Yes  . Types: Marijuana   Comment: not recently-for about 3 weeks now    Social History   Socioeconomic History  . Marital status: Single    Spouse name: None  . Number of children: None  . Years of education: None  . Highest education level: None  Social Needs  . Financial resource strain: None  . Food insecurity - worry: None  . Food insecurity - inability: None  . Transportation needs - medical: None  . Transportation needs - non-medical: None  Occupational History  . None  Tobacco Use  . Smoking status: Never Smoker  . Smokeless tobacco: Never Used  Substance and Sexual Activity  . Alcohol use: Yes    Comment: occas.  . Drug use: Yes    Types: Marijuana    Comment: not recently-for about 3 weeks now  . Sexual activity: None  Other Topics Concern  . None  Social History Narrative  . None    Sleep: Improving  Appetite:  Good  Current Medications: Current Facility-Administered Medications  Medication Dose Route Frequency Provider Last Rate Last Dose  . acetaminophen (TYLENOL) tablet 650 mg  650 mg Oral Q6H PRN Clapacs, John T, MD      . acetaminophen (TYLENOL) tablet 650  mg  650 mg Oral Q6H PRN Haskell RilingMcNew, Elicia Lui R, MD   650 mg at 07/12/17 1440  . alum & mag hydroxide-simeth (MAALOX/MYLANTA) 200-200-20 MG/5ML suspension 30 mL  30 mL Oral Q4H PRN Clapacs, John T, MD      . ARIPiprazole (ABILIFY) tablet 15 mg  15 mg Oral Daily Camay Pedigo, Ileene HutchinsonHolly R, MD   15 mg at 07/13/17 0811  . LORazepam (ATIVAN) tablet 1 mg  1 mg Oral Q4H PRN Ferrah Panagopoulos, Ileene HutchinsonHolly R, MD      . magnesium hydroxide (MILK OF MAGNESIA) suspension 30 mL  30 mL Oral Daily PRN Clapacs, Jackquline DenmarkJohn T, MD      . traZODone (DESYREL) tablet 50 mg  50 mg Oral QHS Shenica Holzheimer, Ileene HutchinsonHolly R, MD        Lab Results:  Results for orders placed or performed during the  hospital encounter of 07/11/17 (from the past 48 hour(s))  Hemoglobin A1c     Status: None   Collection Time: 07/12/17  7:45 AM  Result Value Ref Range   Hgb A1c MFr Bld 5.4 4.8 - 5.6 %    Comment: (NOTE) Pre diabetes:          5.7%-6.4% Diabetes:              >6.4% Glycemic control for   <7.0% adults with diabetes    Mean Plasma Glucose 108.28 mg/dL    Comment: Performed at [facility], [address] [place], [place] [address]  Lipid panel     Status: None   Collection Time: 07/12/17  7:45 AM  Result Value Ref Range   Cholesterol 132 0 - 200 mg/dL   Triglycerides 40 <657<150 mg/dL   HDL 54 >84>40 mg/dL   Total CHOL/HDL Ratio 2.4 RATIO   VLDL 8 0 - 40 mg/dL   LDL Cholesterol 70 0 - 99 mg/dL    Comment:        Total Cholesterol/HDL:CHD Risk Coronary Heart Disease Risk Table                     Men   Women  1/2 Average Risk   3.4   3.3  Average Risk       5.0   4.4  2 X Average Risk   9.6   7.1  3 X Average Risk  23.4   11.0        Use the calculated Patient Ratio above and the CHD Risk Table to determine the patient's CHD Risk.        ATP III CLASSIFICATION (LDL):  <100     mg/dL   Optimal  [id number]  mg/dL   Near or Above                    Optimal  130-159  mg/dL   Borderline  [id number]  mg/dL   High  >440>190     mg/dL   Very High   TSH     Status: None   Collection Time: 07/12/17  7:45 AM  Result Value Ref Range   TSH 0.937 0.350 - 4.500 uIU/mL    Comment: Performed by a 3rd Generation assay with a functional sensitivity of <=0.01 uIU/mL.    Blood Alcohol level:  Lab Results  Component Value Date   ETH <10 07/11/2017    Metabolic Disorder Labs: Lab Results  Component Value Date   HGBA1C 5.4 07/12/2017   MPG 108.28 07/12/2017   No results found for:  PROLACTIN Lab Results  Component Value Date   CHOL 132 07/12/2017   TRIG 40 07/12/2017   HDL 54 07/12/2017   CHOLHDL 2.4 07/12/2017   VLDL 8 07/12/2017   LDLCALC 70 07/12/2017    Physical  Findings: AIMS:  , ,  ,  ,    CIWA:  CIWA-Ar Total: 0 COWS:     Musculoskeletal: Strength & Muscle Tone: within normal limits Gait & Station: normal Patient leans: N/A  Psychiatric Specialty Exam: Physical Exam  Nursing note and vitals reviewed.   Review of Systems  All other systems reviewed and are negative.   Blood pressure 117/80, pulse (!) 101, temperature 98 F (36.7 C), temperature source Oral, resp. rate 16, height 5\' 8"  (1.727 m), weight 74.8 kg (165 lb), SpO2 99 %.Body mass index is 25.09 kg/m.  General Appearance: Casual  Eye Contact:  Good  Speech:  Pressured  Volume:  Increased  Mood:  Euphoric  Affect:  Labile  Thought Process:  Disorganized  Orientation:  Full (Time, Place, and Person)  Thought Content:  Illogical  Suicidal Thoughts:  No  Homicidal Thoughts:  No  Memory:  Immediate;   Good  Judgement:  Poor  Insight:  Poor  Psychomotor Activity:  Increased  Concentration:  Concentration: Fair  Recall:  Fair  Fund of Knowledge:  Fair  Language:  Fair  Akathisia:  No      Assets:  Communication Skills Desire for Improvement Social Support  ADL's:  Intact  Cognition:  WNL  Sleep:  Number of Hours: 8     Treatment Plan Summary: 21 yo male with history of bipolar disorder admitted due to acute manic episode. He continues to exhibit manic symptoms but improving slightly and is sleeping better. He has been compliant with Abilify and agrees to continue this as an outpatient. He is tolerating it well.   Plan:  Bipolar I disorder -Continue Abilify 15 mg daily. This was started yesterday -Decrease trazodone to 50 mg qhs for sleep. Pt slightly drowsy today -EKG QTc 403  Social/Dispo -Pt lives on campus at [address]. I did call both numbers for his mother, Alvira Philips [phone] and [phone]) and left message to call me back. He will need follow up when he discharges.   Haskell Riling, MD 07/13/2017, 10:08 AM

## 2017-07-13 NOTE — Progress Notes (Signed)
Patient ID: Melvin Drake, male   DOB: June 29, 1996, 21 y.o.   MRN: [id number] Restless, hyperactive, neologism, rapid loud pressured speech, intrusive, climbing the chair in the medication room, ongoing grandiosity, impaired insight and manic; early medications given to mitigate behaviors; refused the initial tour of the S&R room and the importance of keeping everyone safe discussed; patient later on came back and would like to check out the S&R only if I promised he is not going to any of the room. 1 other patient, Lorenda HatchetSlade, RN and Jill AlexandersJustin, MHT were with me; patient examined the room and said, "I want to go to the 4 points restraint..." request declined, he ran out of the S&R area, ran up and down the hallways yelling out loud, punched, kicked the entrance door to the unit with enough force to damage the door, ran to another patient's room, came out wearing a jacket that belongs to the other patient.  Milieu locked into half to contain and structure his behaviors; Nursing Supervisor, Ms Helmut Musterlicia on the unit at this time. Patient, exhausted after the disbursed of energy, apologized for his behaviors, agreed to behave in appropriate manners; confessed to a break away plan from BMU, I walked with him to his room, "look, I told you, I have already map out my plan ..." the window seal was damaged and loose, "all I need to do now is to kick the window here and I am out." I pointed my concerns to the CN, OA/Nursing Supervisor; patient was assigned to a room closer to the nurses' station for close observations.  Patient c/o pain 8/10 right wrist, "I think I fractured my wrist from punching the door.." Tylenol 650 mg given. Ice pack provided. Dr. Jennet MaduroPucilowska returned page promptly, a change in patient condition and manic behaviors discussed. Portable X-Ray completed, patient medicated as ordered.

## 2017-07-13 NOTE — [facility] Group Notes (Signed)
  [facility] LCSW Group Therapy Note  Date/Time: 07/13/17, 1300  Type of Therapy/Topic:  Group Therapy:  Emotion Regulation  Participation Level:  Active   Mood: manic  Description of Group:    The purpose of this group is to assist patients in learning to regulate negative emotions and experience positive emotions. Patients will be guided to discuss ways in which they have been vulnerable to their negative emotions. These vulnerabilities will be juxtaposed with experiences of positive emotions or situations, and patients challenged to use positive emotions to combat negative ones. Special emphasis will be placed on coping with negative emotions in conflict situations, and patients will process healthy conflict resolution skills.  Therapeutic Goals: 1. Patient will identify two positive emotions or experiences to reflect on in order to balance out negative emotions:  2. Patient will label two or more emotions that they find the most difficult to experience:  3. Patient will be able to demonstrate positive conflict resolution skills through discussion or role plays:   Summary of Patient Progress:Pt continues to be overly eager in group, speaking out of turn and having multiple comments throughout.  Today, pt asked CSW to add to CSW diagram regarding managing emotions and redrew the diagram into the Star of [place], while sharing the significance of the star with the group.  Unable to focus on any one topic.       Therapeutic Modalities:   Cognitive Behavioral Therapy Feelings Identification Dialectical Behavioral Therapy  Daleen SquibbGreg Jeannene Tschetter, LCSW

## 2017-07-14 MED ORDER — ARIPIPRAZOLE 10 MG PO TABS
20.0000 mg | ORAL_TABLET | Freq: Every day | ORAL | Status: DC
  Administered 2017-07-15 – 2017-07-22 (×8): 20 mg via ORAL
  Filled 2017-07-14 (×8): qty 2

## 2017-07-14 MED ORDER — LORAZEPAM 2 MG PO TABS
2.0000 mg | ORAL_TABLET | Freq: Every day | ORAL | Status: DC
  Administered 2017-07-14: 2 mg via ORAL
  Filled 2017-07-14: qty 1

## 2017-07-14 NOTE — BH Assessment (Signed)
Patient getting off phone when this nurse approached patient.  Pt. Introduced himself and agreed to go into room and talk about patients day.  Pt. States he had a good day at group and felt groups helped him today.  Pt. Is in agreement with doctor on continuing meds. and exploring other ideas when pt. Is discharged.  Pt. Interacted well with this nurse tonight.

## 2017-07-14 NOTE — Progress Notes (Signed)
Patient woke up and was upset due to "feeling sedated."  "I don't understand why you gave me that."  Explained to him that he was hyper this morning and it was given due to some agitation.  Patient also was upset because staff did not wake him up when his aunt called.  Explained to patient that we don't typically wake anyone up unless it is an emergency.  Also the aunt did not have the code number at the time.  Patient became very irritable and stated "how am I supposed to know that?  You think I just should know all these things?"  Explained to patient that medication would be used if he became agitated again to the point he was disrupting the milieu.  Patient walked off.

## 2017-07-14 NOTE — Plan of Care (Signed)
Denies pain at this time.

## 2017-07-14 NOTE — [facility] Group Notes (Signed)
[facility] Group Notes:  (Nursing/MHT/Case Management/Adjunct)  Date:  07/14/2017  Time:  7:37 PM  Type of Therapy:  Psychoeducational Skills  Participation Level:  Active  Participation Quality:  Appropriate and Attentive  Affect:  Appropriate  Cognitive:  Appropriate  Insight:  Appropriate and Good  Engagement in Group:  Engaged  Modes of Intervention:  Discussion and Education  Summary of Progress/Problems:  Melvin CompanionMary  Hamzeh Drake 07/14/2017, 7:37 PM

## 2017-07-14 NOTE — Progress Notes (Signed)
Recreation Therapy Notes  Date: 11.08.18  Time: 1:00pm   Location: Craft Room  Behavioral response: Appropriate  Intervention Topic: Life Planning  Discussion/Intervention: Group Content on today was focused on Life planning. The group described what plans are and how they go about making them. Individuals discussed how they normally plan their day and how long it takes. Patients described what is normally in their plans for the day and if they can plan a day with no money. The group expressed how much free time they normally have in a day. Individuals participated in the intervention "Making a Budget" and learned how much they were spending on things and ways to make plans for their money.  Clinical Observations/Feedback:  Patient came to group late due to unknown reasons. While in attendance individual was focused on the task at hand and was pleasant with peers and staff.   [name] LRT/CTRS       Melvin Drake 07/14/2017 2:06 PM

## 2017-07-14 NOTE — Progress Notes (Signed)
Recreation Therapy Notes  Date: 11.08.18  Time: 3:00pm  Location: Craft room  Behavioral response: N/A  Group Type: Craft  Participation level: N/A  Communication: Patient did not attend group.  Comments: N/A  Elton Heid LRT/CTRS        Khloee Garza 07/14/2017 4:06 PM

## 2017-07-14 NOTE — Progress Notes (Addendum)
[facility] MD Progress Note  07/14/2017 2:58 PM Melvin Drake  MRN:  [id number]   Subjective:  Per RN note, pt was extremely manic last night. He had rapid pressured speech, climbing chair in the medication room. He was stating that he wanted to go into 4 point restraints, running up and down hallways, yelling out loud, punching and kicking entrance door and damaging door. He ran into another patients room and came out wearing a jacket belonging to another patient. He was discussing with staff how he mapped out a plan to escape including the window seal being damaged and loose and stating that all he has to do is kick the window out. Hand XR performed as he was having pain after he punched door which was negative for fracture. He did receive Haldol and Ativan.   Today, pt is still very hyperverbal. He was attempting to explain his behaviors last night. He states that he was upset with the nurse and requested another one. He states, "he was trying to get on my level because he is African and I am African American." He states that he felt that he was being threatened by staff. He states that his aunt never came and he thought he was leaving yesterday (this was never discussed with patient) and he felt very upset. He states that he wanted to be in the seclusion room even though "it wouldn't be seclusion because I would be in there with God." He has flight of ideas stating, "My dad should be here. He has bipolar disorder and I need him to be in here. You guys are threatening my family. I broke the door for a reason because security guards are walking too much." Pt is very argumentative again today and difficult to redirect. We discussed adding another medication and he adamantly refused stating the only medication he is taking is Abilify.   Principal Problem: Bipolar 1 disorder, manic, moderate (HCC) Diagnosis:   Patient Active Problem List   Diagnosis Date Noted  . Bipolar 1 disorder, manic, moderate (HCC) [F31.12]  07/11/2017   Total Time spent with patient: 20 minutes  Past Psychiatric History: See H&P  Past Medical History: History reviewed. No pertinent past medical history. History reviewed. No pertinent surgical history. Family History: History reviewed. No pertinent family history. Family Psychiatric  History: See H&P Social History:  Social History   Substance and Sexual Activity  Alcohol Use Yes   Comment: occas.     Social History   Substance and Sexual Activity  Drug Use Yes  . Types: Marijuana   Comment: not recently-for about 3 weeks now    Social History   Socioeconomic History  . Marital status: Single    Spouse name: None  . Number of children: None  . Years of education: None  . Highest education level: None  Social Needs  . Financial resource strain: None  . Food insecurity - worry: None  . Food insecurity - inability: None  . Transportation needs - medical: None  . Transportation needs - non-medical: None  Occupational History  . None  Tobacco Use  . Smoking status: Never Smoker  . Smokeless tobacco: Never Used  Substance and Sexual Activity  . Alcohol use: Yes    Comment: occas.  . Drug use: Yes    Types: Marijuana    Comment: not recently-for about 3 weeks now  . Sexual activity: None  Other Topics Concern  . None  Social History Narrative  . None  Sleep: Poor  Appetite:  Fair  Current Medications: Current Facility-Administered Medications  Medication Dose Route Frequency Provider Last Rate Last Dose  . acetaminophen (TYLENOL) tablet 650 mg  650 mg Oral Q6H PRN Haskell RilingMcNew, Costella Schwarz R, MD   650 mg at 07/13/17 2122  . alum & mag hydroxide-simeth (MAALOX/MYLANTA) 200-200-20 MG/5ML suspension 30 mL  30 mL Oral Q4H PRN Clapacs, Jackquline DenmarkJohn T, MD      . Melene Muller[START ON 07/15/2017] ARIPiprazole (ABILIFY) tablet 20 mg  20 mg Oral Daily Merica Prell R, MD      . diphenhydrAMINE (BENADRYL) capsule 50 mg  50 mg Oral Q6H PRN Pucilowska, Jolanta B, MD   50 mg at 07/13/17 2240    Or  . diphenhydrAMINE (BENADRYL) injection 50 mg  50 mg Intramuscular Q6H PRN Pucilowska, Jolanta B, MD      . haloperidol (HALDOL) tablet 10 mg  10 mg Oral Q8H PRN Pucilowska, Jolanta B, MD   10 mg at 07/13/17 2216   Or  . haloperidol lactate (HALDOL) injection 10 mg  10 mg Intramuscular Q8H PRN Pucilowska, Jolanta B, MD      . LORazepam (ATIVAN) tablet 2 mg  2 mg Oral Q6H PRN Pucilowska, Jolanta B, MD   2 mg at 07/14/17 0902   Or  . LORazepam (ATIVAN) injection 2 mg  2 mg Intramuscular Q6H PRN Pucilowska, Jolanta B, MD      . LORazepam (ATIVAN) tablet 1 mg  1 mg Oral Q4H PRN Tessi Eustache, Ileene HutchinsonHolly R, MD   1 mg at 07/13/17 2009  . LORazepam (ATIVAN) tablet 2 mg  2 mg Oral QHS Wash Nienhaus R, MD      . magnesium hydroxide (MILK OF MAGNESIA) suspension 30 mL  30 mL Oral Daily PRN Clapacs, Jackquline DenmarkJohn T, MD        Lab Results: No results found for this or any previous visit (from the past 48 hour(s)).  Blood Alcohol level:  Lab Results  Component Value Date   ETH <10 07/11/2017    Metabolic Disorder Labs: Lab Results  Component Value Date   HGBA1C 5.4 07/12/2017   MPG 108.28 07/12/2017   No results found for: PROLACTIN Lab Results  Component Value Date   CHOL 132 07/12/2017   TRIG 40 07/12/2017   HDL 54 07/12/2017   CHOLHDL 2.4 07/12/2017   VLDL 8 07/12/2017   LDLCALC 70 07/12/2017    Physical Findings: AIMS:  , ,  ,  ,    CIWA:  CIWA-Ar Total: 0 COWS:     Musculoskeletal: Strength & Muscle Tone: within normal limits Gait & Station: normal Patient leans: N/A  Psychiatric Specialty Exam: Physical Exam  Nursing note and vitals reviewed.   ROS  Blood pressure 139/77, pulse 82, temperature 97.7 F (36.5 C), resp. rate 16, height 5\' 8"  (1.727 m), weight 74.8 kg (165 lb), SpO2 99 %.Body mass index is 25.09 kg/m.  General Appearance: Casual  Eye Contact:  Fair  Speech:  Clear and Coherent  Volume:  Increased  Mood:  Euphoric  Affect:  Labile  Thought Process:  Disorganized   Orientation:  Full (Time, Place, and Person)  Thought Content:  Paranoid  Suicidal Thoughts:  No  Homicidal Thoughts:  No  Memory:  Immediate;   Fair  Judgement:  Poor  Insight:  Lacking  Psychomotor Activity:  Increased  Concentration:  Concentration: Poor  Recall:  Fair  Fund of Knowledge:  Good  Language:  Fair  Akathisia:  No  Assets:  Resilience  ADL's:  Intact  Cognition:  WNL  Sleep:  Number of Hours: 5.3     Treatment Plan Summary: 21 yo male admitted for manic episode. Pt had very difficult night last night and was very manic punching the door. He continues to be hyperverbal, tangential, delusional and argumentative. He is adamantly refusing to change medications or add any medications and states that he will only take Abilify. I did speak with his mom yesterday who stated that he did have a psychotic episode in high school and did not appear that he had any mood symptoms during that time. Pt appears much more manic this episode with paranoia.  Differential includes Schizoaffective disorder vs Bipolar I disorder. His mother was not able to provide much information about his psychotic episode so unclear what the primary diagnosis is at that time.  Bipolar I disorder vs. Schizoaffective disorder -Increase Abilify to 20 mg daily. Consider LAI if he tolerates oral -Would like to add another mood stabilizing agent however, pt adamantly refuses at this time -Will schedule qhs Ativan for sleep -Will place on IVC due to him signing out AMA  Social/Dispo -[place]. I spoke with his mother [name] yesterday and she [place]   Haskell RilingHolly R Ferne Ellingwood, MD 07/14/2017, 2:58 PM

## 2017-07-14 NOTE — Plan of Care (Signed)
Patient slept for Estimated Hours of 5.30; Precautionary checks every 15 minutes for safety maintained, room free of safety hazards, patient sustains no injury or falls during this shift.  

## 2017-07-14 NOTE — Plan of Care (Signed)
Patient is calmer today due to medication administration this morning.  He is irritable with staff.

## 2017-07-14 NOTE — Progress Notes (Signed)
Patient's family came to visit and this staff nurse spoke with them with Sion's consent.  Patient wanted to see some documents out of his medical chart and explained to him that I could not produce those documents.  Patient's aunt voiced concern because a family member passed away and she feels patient will be worse because he is unable to attend the funeral.  Anette Riedeloah repeatedly said he was voluntary and I explained that he is under IVC commitment at this time.  I explained the process to his family, which consisted of his aunt and cousin.  Anette Riedeloah had already discussed his disruptive behavior to them and I concurred that he was aggressive toward staff last night.  His family was agreeable and stated they wanted to know his "prognosis and diagnosis."  Explained to them that I could not diagnosis, as that was out of my scope of practice.  I did give them the social worker's extension and asked them to call tomorrow.  They are concerned about Anette Riedeloah and appear to be very supportive. They were appreciative with staff.  Anette Riedeloah also told this staff member that "I will be on my best behavior tonight."

## 2017-07-14 NOTE — [facility] Suicide Risk Assessment (Addendum)
[facility] INPATIENT:  Family/Significant Other Suicide Prevention Education  Suicide Prevention Education:  Contact Attempts: Melvin PoseyGeraldine Drake. Mother, [phone], (name of family member/significant other) has been identified by the patient as the family member/significant other with whom the patient will be residing, and identified as the person(s) who will aid the patient in the event of a mental health crisis.  With written consent from the patient, two attempts were made to provide suicide prevention education, prior to and/or following the patient's discharge.  We were unsuccessful in providing suicide prevention education.  A suicide education pamphlet was given to the patient to share with family/significant other.  Date and time of second attempt: 07/14/17 at 12:50 PM also 07/14/17 at 5:30 PM  Sallee LangeAnne C Gurney Balthazor 07/14/2017, 12:51 PM

## 2017-07-14 NOTE — Progress Notes (Signed)
D: Patient is pacing the hallway.  He is observed dancing and talking loudly.  Patient was given his morning abilify, however, refused the 2 mg ativan at first.  He stated he didn't want to take the ativan.  He states, "I want this place to be one big playgound!  This should be a FUN place!"  He is hyperverbal and exhibits restlessness and is fidgety.  Patient finally took his 2 mg ativan and went to group.  He explained to staff that he was "playing" when he kicked the door and punched the exit sign.  He asked if his hand was broken and he was informed the x-ray did not reveal any pertinent injury to his hand. A: Continue to monitor medication management and MD orders.  Safety checks completed every 15 minutes per protocol.  Offer support and encouragement as needed. R: Patient is receptive to staff; he is redirectable at this time.

## 2017-07-14 NOTE — [facility] Group Notes (Signed)
Goals Group Date/Time: 07/14/2017 9:00 AM Type of Therapy and Topic: Group Therapy: Goals Group: SMART Goals   Participation Level: Moderate  Description of Group:    The purpose of a daily goals group is to assist and guide patients in setting recovery/wellness-related goals. The objective is to set goals as they relate to the crisis in which they were admitted. Patients will be using SMART goal modalities to set measurable goals. Characteristics of realistic goals will be discussed and patients will be assisted in setting and processing how one will reach their goal. Facilitator will also assist patients in applying interventions and coping skills learned in psycho-education groups to the SMART goal and process how one will achieve defined goal.   Therapeutic Goals:   -Patients will develop and document one goal related to or their crisis in which brought them into treatment.  -Patients will be guided by LCSW using SMART goal setting modality in how to set a measurable, attainable, realistic and time sensitive goal.  -Patients will process barriers in reaching goal.  -Patients will process interventions in how to overcome and successful in reaching goal.   Patient's Goal:Pt goal is to shower and to be discharged today.   Therapeutic Modalities:  Motivational Interviewing  Research officer, political partyCognitive Behavioral Therapy  Crisis Intervention Model  SMART goals setting   Daleen SquibbGreg Bellina Tokarczyk, [place]

## 2017-07-14 NOTE — [facility] Group Notes (Signed)
[facility] Group Notes:  (Nursing/MHT/Case Management/Adjunct)  Date:  07/14/2017  Time:  9:09 PM  Type of Therapy:  Evening Wrap-up Group  Participation Level:  Active  Participation Quality:  Appropriate and Attentive  Affect:  Appropriate  Cognitive:  Alert and Appropriate  Insight:  Appropriate, Good and Improving  Engagement in Group:  Developing/Improving and Engaged  Modes of Intervention:  Discussion  Summary of Progress/Problems:  Tomasita MorrowChelsea Nanta Zadyn Yardley 07/14/2017, 9:09 PM

## 2017-07-14 NOTE — [facility] Group Notes (Signed)
Goals Group Date/Time: 07/14/2017 9:00 AM Type of Therapy and Topic: Group Therapy: Goals Group: SMART Goals   Participation Level: Moderate  Description of Group:    The purpose of a daily goals group is to assist and guide patients in setting recovery/wellness-related goals. The objective is to set goals as they relate to the crisis in which they were admitted. Patients will be using SMART goal modalities to set measurable goals. Characteristics of realistic goals will be discussed and patients will be assisted in setting and processing how one will reach their goal. Facilitator will also assist patients in applying interventions and coping skills learned in psycho-education groups to the SMART goal and process how one will achieve defined goal.   Therapeutic Goals:   -Patients will develop and document one goal related to or their crisis in which brought them into treatment.  -Patients will be guided by LCSW using SMART goal setting modality in how to set a measurable, attainable, realistic and time sensitive goal.  -Patients will process barriers in reaching goal.  -Patients will process interventions in how to overcome and successful in reaching goal.   Patient's Goal: Pt identified physical, mental/emotional, and creative as areas that are out of balance.  Pt continues to require constant redirection in group, asking if he can come up front and "teach the class" and wanting to tell long answers including things like e[place] history that are unrelated to the group discussion.   Therapeutic Modalities:  Motivational Interviewing  Research officer, political partyCognitive Behavioral Therapy  Crisis Intervention Model  SMART goals setting   Daleen SquibbGreg Hermione Havlicek, [place]CS[place]

## 2017-07-15 MED ORDER — LORAZEPAM 1 MG PO TABS
1.0000 mg | ORAL_TABLET | Freq: Every day | ORAL | Status: DC
  Administered 2017-07-15 – 2017-07-21 (×7): 1 mg via ORAL
  Filled 2017-07-15 (×7): qty 1

## 2017-07-15 NOTE — Progress Notes (Signed)
Received Melvin Drake this am in the hallway on the floor stretching. He arrived to the medication room and took his medication without incident. He has been visible in the milieu at intervals, but not overly intrusive. In the afternoon he requested Tylenol for a headache, but after a few minutes talking with the male security guard his headache was gone. They continued to talk for a few more minutes then retreated to his room. This PM he became more intrusive with several requests from the staff. He talked on the phone with several family members and friends.

## 2017-07-15 NOTE — Progress Notes (Signed)
[facility]  07/15/2017 1:52 PM Melvin Drake  MRN:  [id number] Subjective:  Pt had a better night last night and had no aggression. He is slightly more organized and less pressured today. He states that he has a funeral tomorrow that he really wants to make it to. He is able to have a much more rational conversation about this today. I spoke with him again later int he day and was still very labile tearful and slightly argumentative but able to process what we were talking about. He still was slightly disorganized in thoughts. Pt was able to accept the fact that he was not ready to discharge and requested a rabbi from [facility] to come visit him.  I spoke with his Brita Rompunt Caroline who came to visit him last night. She did confirm that there is a family funeral tomorrow. I discussed that I did not think he was quite ready to discharge and she states that she agrees with what our treatment team feels because they are not sure if they would be able to handle things if he was not quite stable. His mother is coming to town tomorrow to visit him.   I spoke with his advisor at [facility] to touch base, Maria SteinMarcus. I requested him to see if a rabbi could visit him in the hospital and he said he would ask around if this was possible. [name] is very supportive and talked to him recently and thought he sounded much better.   Principal Problem: Bipolar 1 disorder, manic, moderate (HCC) Diagnosis:   Patient Active Problem List   Diagnosis Date Noted  . Bipolar 1 disorder, manic, moderate (HCC) [F31.12] 07/11/2017   Total Time spent with patient: 20 minutes  Past Psychiatric History: See H&P  Past Medical History: History reviewed. No pertinent past medical history. History reviewed. No pertinent surgical history. Family History: History reviewed. No pertinent family history. Family Psychiatric  History: See H&P Social History:  Social History   Substance and Sexual Activity  Alcohol Use Yes   Comment: occas.      Social History   Substance and Sexual Activity  Drug Use Yes  . Types: Marijuana   Comment: not recently-for about 3 weeks now    Social History   Socioeconomic History  . Marital status: Single    Spouse name: None  . Number of children: None  . Years of education: None  . Highest education level: None  Social Needs  . Financial resource strain: None  . Food insecurity - worry: None  . Food insecurity - inability: None  . Transportation needs - medical: None  . Transportation needs - non-medical: None  Occupational History  . None  Tobacco Use  . Smoking status: Never Smoker  . Smokeless tobacco: Never Used  Substance and Sexual Activity  . Alcohol use: Yes    Comment: occas.  . Drug use: Yes    Types: Marijuana    Comment: not recently-for about 3 weeks now  . Sexual activity: None  Other Topics Concern  . None  Social History Narrative  . None    Sleep: Improving  Appetite:  Good  Current Medications: Current Facility-Administered Medications  Medication Dose Route Frequency Provider Last Rate Last Dose  . acetaminophen (TYLENOL) tablet 650 mg  650 mg Oral Q6H PRN Haskell RilingMcNew, Holly R, MD   650 mg at 07/13/17 2122  . alum & mag hydroxide-simeth (MAALOX/MYLANTA) 200-200-20 MG/5ML suspension 30 mL  30 mL Oral Q4H PRN Clapacs, John  T, MD      . ARIPiprazole (ABILIFY) tablet 20 mg  20 mg Oral Daily McNew, Ileene HutchinsonHolly R, MD   20 mg at 07/15/17 0806  . diphenhydrAMINE (BENADRYL) capsule 50 mg  50 mg Oral Q6H PRN Pucilowska, Jolanta B, MD   50 mg at 07/13/17 2240   Or  . diphenhydrAMINE (BENADRYL) injection 50 mg  50 mg Intramuscular Q6H PRN Pucilowska, Jolanta B, MD      . haloperidol (HALDOL) tablet 10 mg  10 mg Oral Q8H PRN Pucilowska, Jolanta B, MD   10 mg at 07/13/17 2216   Or  . haloperidol lactate (HALDOL) injection 10 mg  10 mg Intramuscular Q8H PRN Pucilowska, Jolanta B, MD      . LORazepam (ATIVAN) tablet 2 mg  2 mg Oral Q6H PRN Pucilowska, Jolanta B, MD   2 mg at  07/14/17 0902   Or  . LORazepam (ATIVAN) injection 2 mg  2 mg Intramuscular Q6H PRN Pucilowska, Jolanta B, MD      . LORazepam (ATIVAN) tablet 1 mg  1 mg Oral Q4H PRN McNew, Ileene HutchinsonHolly R, MD   1 mg at 07/13/17 2009  . LORazepam (ATIVAN) tablet 2 mg  2 mg Oral QHS McNew, Ileene HutchinsonHolly R, MD   2 mg at 07/14/17 2113  . magnesium hydroxide (MILK OF MAGNESIA) suspension 30 mL  30 mL Oral Daily PRN Clapacs, Jackquline DenmarkJohn T, MD        Lab Results: No results found for this or any previous visit (from the past 48 hour(s)).  Blood Alcohol level:  Lab Results  Component Value Date   ETH <10 07/11/2017    Metabolic Disorder Labs: Lab Results  Component Value Date   HGBA1C 5.4 07/12/2017   MPG 108.28 07/12/2017   No results found for: PROLACTIN Lab Results  Component Value Date   CHOL 132 07/12/2017   TRIG 40 07/12/2017   HDL 54 07/12/2017   CHOLHDL 2.4 07/12/2017   VLDL 8 07/12/2017   LDLCALC 70 07/12/2017    Physical Findings: AIMS:  , ,  ,  ,    CIWA:  CIWA-Ar Total: 0 COWS:     Musculoskeletal: Strength & Muscle Tone: within normal limits Gait & Station: normal Patient leans: N/A  Psychiatric Specialty Exam: Physical Exam  Nursing note and vitals reviewed.   Review of Systems  All other systems reviewed and are negative.   Blood pressure 131/72, pulse 89, temperature 97.9 F (36.6 C), temperature source Oral, resp. rate 18, height 5\' 8"  (1.727 m), weight 74.8 kg (165 lb), SpO2 100 %.Body mass index is 25.09 kg/m.  General Appearance: Casual  Eye Contact:  Fair  Speech:  Clear and Coherent  Volume:  Normal  Mood:  Labile  Affect:  Labile  Thought Process:  Disorganized but improving  Orientation:  Full (Time, Place, and Person)  Thought Content:  Illogical  Suicidal Thoughts:  No  Homicidal Thoughts:  No  Memory:  Immediate;   Fair  Judgement:  Poor  Insight:  Lacking  Psychomotor Activity:  Restlessness  Concentration:  Concentration: Fair  Recall:  FiservFair  Fund of Knowledge:   Fair  Language:  Fair  Akathisia:  No      Assets:  Communication Skills Desire for Improvement Social Support  ADL's:  Intact  Cognition:  WNL  Sleep:  Number of Hours: 8     Treatment Plan Summary: 21 yo male admitted due to acute mania. Pt had better night last night and  slept much better. He is much more calm and organized in thoughts today. He continues to be slightly disorganized and labile but is showing improvements. He is tolerating Abilify.  Plan:  Bipolar I disorder -Continue Abilify 20 mg daily -Decrease Ativan to 1 mg qhs  Social/Dispo -Pt [place] on [place] [facility]. I spoke with his mother Alvira Philips and she plans to visit on Saturday from [place]. I also spoke with his Aunt Rayfield Citizen today and his Herbalist, Recruitment consultant at [facility], MD 07/15/2017, 1:52 PM

## 2017-07-15 NOTE — Progress Notes (Signed)
Recreation Therapy Notes  Date: 11.09.18  Time: 9:30 am  Location: Craft Room  Behavioral response: Appropriate  Intervention Topic: Communication  Discussion/Intervention: Group content on today was focused on communication. The group defined communication and described healthy ways they communicate. Individuals expressed ways they have dealt with communication in the past. Patients identified the key components in communication and the most important part of communication. The group participated in the intervention "Board Scramble" patient were broken into groups had to communicate with one another to unscramble the words on the board.  Clinical Observations/Feedback:  Patient came to group and expressed that communication is the bases for relationships. He expressed that there are two types of communication verbal and nonverbal. Individual described that communication is needed to express emotions and to be honest. Patient was on topic and focused during group.  Melvin Drake LRT/CTRS         Taline Nass 07/15/2017 2:16 PM

## 2017-07-15 NOTE — [facility] Group Notes (Signed)
[facility] LCSW Group Therapy Note  Date/Time: 07/15/17, 1300  Type of Therapy and Topic:  Group Therapy:  Feelings around Relapse and Recovery  Participation Level:  Active   Mood:more subdued  Description of Group:    Patients in this group will discuss emotions they experience before and after a relapse. They will process how experiencing these feelings, or avoidance of experiencing them, relates to having a relapse. Facilitator will guide patients to explore emotions they have related to recovery. Patients will be encouraged to process which emotions are more powerful. They will be guided to discuss the emotional reaction significant others in their lives may have to patients' relapse or recovery. Patients will be assisted in exploring ways to respond to the emotions of others without this contributing to a relapse.  Therapeutic Goals: 1. Patient will identify two or more emotions that lead to relapse for them:  2. Patient will identify two emotions that result when they relapse:  3. Patient will identify two emotions related to recovery:  4. Patient will demonstrate ability to communicate their needs through discussion and/or role plays.   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  He was much more in control and less hyperverbal today.  Pt left group at one point and returned, but appears to be doing better.  More appropriate engagement in the group discussion.     Therapeutic Modalities:   Cognitive Behavioral Therapy Solution-Focused Therapy Assertiveness Training Relapse Prevention Therapy  Melvin SquibbGreg Barack Nicodemus, LCSW

## 2017-07-15 NOTE — Progress Notes (Signed)
Chaplain rounding the unit was stopped by pt. Pt [facility] in the family. He also informed CH that his family were on the way to visit with him. Pt stated he would like to attend the funeral so that "he can rise the dead person."  Although some of the thing pt stated did not make sense, pt was religious reflective and engage the entire time [facility] talked [facility]. Also, pt told CH that he had told the RN to ask the [facility] at [facility] to visit him. CH not able to confirm this with RN but will follow up with RN as needed. Pt appeared to be affected by the loss, confused and grieving.     07/15/17 1700  Clinical Encounter Type  Visited With Patient  Visit Type Initial;Follow-up;Psychological support;Social support  Referral From Chaplain  Spiritual Encounters  Spiritual Needs Emotional;Other (Comment)

## 2017-07-16 NOTE — [facility] Group Notes (Signed)
[facility] Group Notes:  (Nursing/MHT/Case Management/Adjunct)  Date:  07/16/2017  Time:  5:53 AM  Type of Therapy:  Psychoeducational Skills  Participation Level:  Active  Participation Quality:  Appropriate, Attentive and Sharing  Affect:  Appropriate  Cognitive:  Appropriate  Insight:  Appropriate and Good  Engagement in Group:  Engaged  Modes of Intervention:  Discussion, Socialization and Support  Summary of Progress/Problems:  Chancy MilroyLaquanda Y Burak Zerbe 07/16/2017, 5:53 AM

## 2017-07-16 NOTE — Social Work (Addendum)
VM from mother, Melvin PoseyGeraldine Drake, stating that she has received calls from CSW but had family funeral to attend.  Would like to speak w CSW this weekend.  CSW called mother, was unable to reach mother at approx 9 AM and 4 PM on 07/16/17.    Santa GeneraAnne Nyelli Samara, LCSW Lead Clinical Social Worker Phone:  [phone]

## 2017-07-16 NOTE — [facility] Group Notes (Signed)
07/16/2017 1:15pm  Type of Therapy/Topic:  Group Therapy:  Balance in Life  Participation Level:  Active  Description of Group:   This group will address the concept of balance and how it feels and looks when one is unbalanced. Patients will be encouraged to process areas in their lives that are out of balance and identify reasons for remaining unbalanced. Facilitators will guide patients in utilizing problem-solving interventions to address and correct the stressor making their life unbalanced. Understanding and applying boundaries will be explored and addressed for obtaining and maintaining a balanced life. Patients will be encouraged to explore ways to assertively make their unbalanced needs known to significant others in their lives, using other group members and facilitator for support and feedback.  Therapeutic Goals: 1. Patient will identify two or more emotions or situations they have that consume much of in their lives. 2. Patient will identify signs/triggers that life has become out of balance:  3. Patient will identify two ways to set boundaries in order to achieve balance in their lives:  4. Patient will demonstrate ability to communicate their needs through discussion and/or role plays  Summary of Patient Progress: Patient reports that he recently lost someone in his family. He indicates that he is having a hard time dealing with this loss. Patient identified manic, depression, and compulsion as some of the emotions that are consuming his life. He reports that he is learning to live with his condition of Bipolar Disorder 1 and removing the stigma. Patient reported that he is aware of the things that are creating chaos in his life and is trying to work on them to create balance and leave the hospital and have a normal live. Patient fully engaged in group discussion.       Therapeutic Modalities:   Cognitive Behavioral Therapy Solution-Focused Therapy Assertiveness  Training  Ireland Virrueta  CUEBAS-COLON, LCSW 07/16/2017 1:04 PM

## 2017-07-16 NOTE — [facility] Group Notes (Signed)
[facility] Group Notes:  (Nursing/MHT/Case Management/Adjunct)  Date:  07/16/2017  Time:  9:22 PM  Type of Therapy:  Group Therapy  Participation Level:  Active  Participation Quality:  Asking when is group over.  Affect:  Appropriate  Cognitive:  Alert  Insight:  Good  Engagement in Group:  Engaged  Modes of Intervention:  Support  Summary of Progress/Problems:  Mayra NeerJackie L Larene Ascencio 07/16/2017, 9:22 PM

## 2017-07-16 NOTE — Progress Notes (Signed)
Pleasant on contact, hyperactive and silly at times. Mom and brother visited. Seemed to go well. Was med compliant, euphoric mood and affect at times. States " I feel normal". Social with staff and peers. No physical complaints. Remains on routine obs for safety.

## 2017-07-16 NOTE — Plan of Care (Signed)
Patient alert and oriented. He is hyperverbal with speech but has shown some improvements. He is pleasant. He denies si, hi,a vh. No pain. He is med compliant. He is hyper religious.

## 2017-07-16 NOTE — Progress Notes (Addendum)
[facility]  07/16/2017 12:11 PM Melvin Drake  MRN:  [id number]   Subjective:    The patient does appear to have slow down and thoughts are more organized today. He is showing some insight into manic episode and is asking for forgiveness for his activity prior to admission and for breaking the door. He is denying any current active or passive suicidal thoughts or psychotic symptoms. He denies any problems with insomnia and says he slept much better last night although nursing reported only 5 hours. He denies any auditory or visual hallucinations. No current or thoughts or delusions. He did get sad and tearful when talking about a family friend who passed away. He is no longer requesting discharge and says he feels like he does need to stay in the hospital. He is looking forward to his mom, sister and brother coming to visit him today. The patient has been attending groups and says it is helping him with his pornography. He says that every night he feels urges to watch pornography but denies watching pornography for longer than 5-10 minutes and does not watch pornography during the daytime. So far, he has been compliant with psychotropic medications and denies any physical adverse side effects associated with medications. Vital signs have been stable. He has not needed any when necessary Ativan instead November 8.   Past Psychiatric History: History of bipolar disorder. He does not have outpatient providers. He was inpatient once in high school for similar episode. He denies history of suicide attempts.   [place] He is attending [employer] on full scholarship. He is a Medical laboratory scientific officersophomore. He has a brother and is close to his mother. His father was incarcerated when he was younger.          Principal Problem: Bipolar 1 disorder, manic, moderate (HCC) Diagnosis:   Patient Active Problem List   Diagnosis Date Noted  . Bipolar 1 disorder, manic, moderate (HCC)  [F31.12] 07/11/2017   Total Time spent with patient: 30 minutes  Social History:  Social History   Substance and Sexual Activity  Alcohol Use Yes   Comment: occas.     Social History   Substance and Sexual Activity  Drug Use Yes  . Types: Marijuana   Comment: not recently-for about 3 weeks now    Social History   Socioeconomic History  . Marital status: Single    Spouse name: None  . Number of children: None  . Years of education: None  . Highest education level: None  Social Needs  . Financial resource strain: None  . Food insecurity - worry: None  . Food insecurity - inability: None  . Transportation needs - medical: None  . Transportation needs - non-medical: None  Occupational History  . None  Tobacco Use  . Smoking status: Never Smoker  . Smokeless tobacco: Never Used  Substance and Sexual Activity  . Alcohol use: Yes    Comment: occas.  . Drug use: Yes    Types: Marijuana    Comment: not recently-for about 3 weeks now  . Sexual activity: None  Other Topics Concern  . None  Social History Narrative  . None    Sleep: Improving  Appetite:  Good  Current Medications: Current Facility-Administered Medications  Medication Dose Route Frequency Provider Last Rate Last Dose  . acetaminophen (TYLENOL) tablet 650 mg  650 mg Oral Q6H PRN Haskell RilingMcNew, Holly R, MD   650 mg at 07/13/17 2122  . alum &  mag hydroxide-simeth (MAALOX/MYLANTA) 200-200-20 MG/5ML suspension 30 mL  30 mL Oral Q4H PRN Clapacs, John T, MD      . ARIPiprazole (ABILIFY) tablet 20 mg  20 mg Oral Daily McNew, Ileene Hutchinson, MD   20 mg at 07/16/17 0732  . diphenhydrAMINE (BENADRYL) capsule 50 mg  50 mg Oral Q6H PRN Pucilowska, Jolanta B, MD   50 mg at 07/13/17 2240   Or  . diphenhydrAMINE (BENADRYL) injection 50 mg  50 mg Intramuscular Q6H PRN Pucilowska, Jolanta B, MD      . haloperidol (HALDOL) tablet 10 mg  10 mg Oral Q8H PRN Pucilowska, Jolanta B, MD   10 mg at 07/13/17 2216   Or  . haloperidol  lactate (HALDOL) injection 10 mg  10 mg Intramuscular Q8H PRN Pucilowska, Jolanta B, MD      . LORazepam (ATIVAN) tablet 2 mg  2 mg Oral Q6H PRN Pucilowska, Jolanta B, MD   2 mg at 07/14/17 0902   Or  . LORazepam (ATIVAN) injection 2 mg  2 mg Intramuscular Q6H PRN Pucilowska, Jolanta B, MD      . LORazepam (ATIVAN) tablet 1 mg  1 mg Oral Q4H PRN McNew, Ileene Hutchinson, MD   1 mg at 07/13/17 2009  . LORazepam (ATIVAN) tablet 1 mg  1 mg Oral QHS McNew, Ileene Hutchinson, MD   1 mg at 07/15/17 2120  . magnesium hydroxide (MILK OF MAGNESIA) suspension 30 mL  30 mL Oral Daily PRN Clapacs, Jackquline Denmark, MD        Lab Results: No results found for this or any previous visit (from the past 48 hour(s)).  Blood Alcohol level:  Lab Results  Component Value Date   ETH <10 07/11/2017    Metabolic Disorder Labs: Lab Results  Component Value Date   HGBA1C 5.4 07/12/2017   MPG 108.28 07/12/2017   No results found for: PROLACTIN Lab Results  Component Value Date   CHOL 132 07/12/2017   TRIG 40 07/12/2017   HDL 54 07/12/2017   CHOLHDL 2.4 07/12/2017   VLDL 8 07/12/2017   LDLCALC 70 07/12/2017     Musculoskeletal: Strength & Muscle Tone: within normal limits Gait & Station: normal Patient leans: N/A  Psychiatric Specialty Exam: Physical Exam  Nursing note and vitals reviewed.   Review of Systems  Constitutional: Negative.   HENT: Negative.   Eyes: Negative.   Respiratory: Negative.   Cardiovascular: Negative.   Gastrointestinal: Negative.   Genitourinary: Negative.   Musculoskeletal: Negative.   Skin: Negative.   Neurological: Negative.   Endo/Heme/Allergies: Negative.     Blood pressure 119/76, pulse 69, temperature 98 F (36.7 C), temperature source Oral, resp. rate 18, height 5\' 8"  (1.727 m), weight 74.8 kg (165 lb), SpO2 100 %.Body mass index is 25.09 kg/m.  General Appearance: Casual  Eye Contact:  Good  Speech:  Clear and Coherent  Volume:  Normal  Mood:  "I feel better"  Affect:  Less  labile  Thought Process:  Disorganized but improving; very tangential  Orientation:  Full (Time, Place, and Person)  Thought Content:  Illogical  Suicidal Thoughts:  No  Homicidal Thoughts:  No  Memory:  Immediate;   Fair  Judgement:  Poor  Insight:  Lacking  Psychomotor Activity:  Restlessness  Concentration:  Concentration: Fair  Recall:  [employer] of Knowledge:  Fair  Language:  Fair  Akathisia:  No      Assets:  Communication Skills Desire for Improvement Social Support  ADL's:  Intact  Cognition:  WNL  Sleep:  Number of Hours: 5.25     Treatment Plan Summary: 21 yo male admitted due to acute mania. Pt had better night last night and slept much better. He is much more calm and organized in thoughts today. He continues to be slightly disorganized and labile but is showing improvements. He is tolerating Abilify.  Plan:  Bipolar I disorder -Continue Abilify 20 mg daily -Continue Ativan to 1 mg nightly -Total cholesterol was 132 and hemoglobin A1c was 5.4 - EKG showed no prolongation of QTc  Right Hand x-ray negative for any fracture  Social/Dispo -[facility] [employer] [facility]. Dr Johnella MoloneyMcNew spoke with his mother [name] and [place] Johnella MoloneyMcNew also spoke with his Brita Rompunt Caroline and his Sherrie [employer] advisor, [name] at [employer] He has not needed any when necessary Ativan since November 8.   Levora AngelKAPUR,AARTI KAMAL, MD 07/16/2017, 12:11 PM

## 2017-07-17 MED ORDER — DIVALPROEX SODIUM 500 MG PO DR TAB
500.0000 mg | DELAYED_RELEASE_TABLET | Freq: Once | ORAL | Status: AC
  Administered 2017-07-17: 500 mg via ORAL
  Filled 2017-07-17: qty 1

## 2017-07-17 MED ORDER — DIVALPROEX SODIUM ER 500 MG PO TB24
750.0000 mg | ORAL_TABLET | Freq: Every day | ORAL | Status: DC
  Administered 2017-07-17 – 2017-07-21 (×5): 750 mg via ORAL
  Filled 2017-07-17 (×6): qty 1

## 2017-07-17 NOTE — Progress Notes (Signed)
[facility] MD Progress Note  07/17/2017 9:02 AM Melvin Drake  MRN:  [id number]   Subjective:    The patient got more euphoric and hyper on Saturday evening and Sunday morning. He has been hyperreligious, intrusive, and flirting excessively with nursing students. Speech remains rapid and pressured. He is still very emotionally labile and at times crying and at other times laughing. He denies any current active or passive suicidal thoughts or psychotic symptoms including auditory or visual hallucinations. No paranoid thoughts but he does have some mild grandiosity. He slept 6 hours last night. Appetite is good. His mother, brother and a friend from the [place] yesterday for family visiting him today. He got tearful when talking about his family friend passed away. He denies any new somatic complaints. Vital signs have been stable.  Past Psychiatric History: History of bipolar disorder. He does not have outpatient providers. He was inpatient once in high school for similar episode. He denies history of suicide attempts.   Social History: From [place]. He is attending [employer] on full scholarship. He is a Medical laboratory scientific officer. He has a brother and is close to his mother. His father was incarcerated when he was younger.   Family Psychiatric History: Long history of bipolar disorder and alcohol dependence         Principal Problem: Bipolar 1 disorder, manic, moderate (HCC) Diagnosis:   Patient Active Problem List   Diagnosis Date Noted  . Bipolar 1 disorder, manic, moderate (HCC) [F31.12] 07/11/2017   Total Time spent with patient: 30 minutes  Social History:  Social History   Substance and Sexual Activity  Alcohol Use Yes   Comment: occas.     Social History   Substance and Sexual Activity  Drug Use Yes  . Types: Marijuana   Comment: not recently-for about 3 weeks now    Social History   Socioeconomic History  . Marital status: Single    Spouse name: None  . Number  of children: None  . Years of education: None  . Highest education level: None  Social Needs  . Financial resource strain: None  . Food insecurity - worry: None  . Food insecurity - inability: None  . Transportation needs - medical: None  . Transportation needs - non-medical: None  Occupational History  . None  Tobacco Use  . Smoking status: Never Smoker  . Smokeless tobacco: Never Used  Substance and Sexual Activity  . Alcohol use: Yes    Comment: occas.  . Drug use: Yes    Types: Marijuana    Comment: not recently-for about 3 weeks now  . Sexual activity: None  Other Topics Concern  . None  Social History Narrative  . None    Sleep: Improving  Appetite:  Good  Current Medications: Current Facility-Administered Medications  Medication Dose Route Frequency Provider Last Rate Last Dose  . acetaminophen (TYLENOL) tablet 650 mg  650 mg Oral Q6H PRN Haskell Riling, MD   650 mg at 07/13/17 2122  . alum & mag hydroxide-simeth (MAALOX/MYLANTA) 200-200-20 MG/5ML suspension 30 mL  30 mL Oral Q4H PRN Clapacs, John T, MD      . ARIPiprazole (ABILIFY) tablet 20 mg  20 mg Oral Daily McNew, Ileene Hutchinson, MD   20 mg at 07/17/17 0734  . diphenhydrAMINE (BENADRYL) capsule 50 mg  50 mg Oral Q6H PRN Pucilowska, Jolanta B, MD   50 mg at 07/13/17 2240   Or  . diphenhydrAMINE (BENADRYL) injection 50 mg  50 mg  Intramuscular Q6H PRN Pucilowska, Jolanta B, MD      . divalproex (DEPAKOTE ER) 24 hr tablet 750 mg  750 mg Oral QHS Darliss RidgelKapur, Tobiah Celestine K, MD      . divalproex (DEPAKOTE) DR tablet 500 mg  500 mg Oral Once Darliss RidgelKapur, Fay Swider K, MD      . haloperidol (HALDOL) tablet 10 mg  10 mg Oral Q8H PRN Pucilowska, Jolanta B, MD   10 mg at 07/13/17 2216   Or  . haloperidol lactate (HALDOL) injection 10 mg  10 mg Intramuscular Q8H PRN Pucilowska, Jolanta B, MD      . LORazepam (ATIVAN) tablet 2 mg  2 mg Oral Q6H PRN Pucilowska, Jolanta B, MD   2 mg at 07/14/17 0902   Or  . LORazepam (ATIVAN) injection 2 mg  2 mg  Intramuscular Q6H PRN Pucilowska, Jolanta B, MD      . LORazepam (ATIVAN) tablet 1 mg  1 mg Oral Q4H PRN McNew, Ileene HutchinsonHolly R, MD   1 mg at 07/13/17 2009  . LORazepam (ATIVAN) tablet 1 mg  1 mg Oral QHS McNew, Ileene HutchinsonHolly R, MD   1 mg at 07/16/17 2145  . magnesium hydroxide (MILK OF MAGNESIA) suspension 30 mL  30 mL Oral Daily PRN Clapacs, Jackquline DenmarkJohn T, MD        Lab Results: No results found for this or any previous visit (from the past 48 hour(s)).  Blood Alcohol level:  Lab Results  Component Value Date   ETH <10 07/11/2017    Metabolic Disorder Labs: Lab Results  Component Value Date   HGBA1C 5.4 07/12/2017   MPG 108.28 07/12/2017   No results found for: PROLACTIN Lab Results  Component Value Date   CHOL 132 07/12/2017   TRIG 40 07/12/2017   HDL 54 07/12/2017   CHOLHDL 2.4 07/12/2017   VLDL 8 07/12/2017   LDLCALC 70 07/12/2017     Musculoskeletal: Strength & Muscle Tone: within normal limits Gait & Station: normal Patient leans: N/A  Psychiatric Specialty Exam: Physical Exam  Nursing note and vitals reviewed.   Review of Systems  Constitutional: Negative.   HENT: Negative.   Eyes: Negative.   Respiratory: Negative.   Cardiovascular: Negative.   Gastrointestinal: Negative.   Genitourinary: Negative.   Musculoskeletal: Negative.   Skin: Negative.   Neurological: Negative.   Endo/Heme/Allergies: Negative.     Blood pressure 134/79, pulse (!) 56, temperature 98.2 F (36.8 C), resp. rate 18, height 5\' 8"  (1.727 m), weight 74.8 kg (165 lb), SpO2 100 %.Body mass index is 25.09 kg/m.  General Appearance: Casual  Eye Contact:  Good  Speech:  Clear and Coherent; Rapid and pressured  Volume:  Normal  Mood:  Euphoric  Affect:  Labile  Thought Process:  Disorganized   Orientation:  Full (Time, Place, and Person)  Thought Content:  Illogical  Suicidal Thoughts:  No  Homicidal Thoughts:  No  Memory:  Immediate;   Fair  Judgement:  Poor  Insight:  Lacking  Psychomotor  Activity:  Restlessness  Concentration:  Concentration: Fair  Recall:  [employer]  Fund of Knowledge:  Fair  Language:  Fair  Akathisia:  No      Assets:  Communication Skills Desire for Improvement Social Support  ADL's:  Intact  Cognition:  WNL  Sleep:  Number of Hours: 6.3     Treatment Plan Summary: 21 yo male admitted due to acute mania. Pt had better night last night and slept much better. He is much more  calm and organized in thoughts today. He continues to be slightly disorganized and labile but is showing improvements. He is tolerating Abilify.  Plan:  Bipolar I disorder - Secondary to euphoria, will add Depakote ER 750mg  po nightly and give a one time loading dose of 500mg  today, 07/17/17. Will need to check LFTs and VPA level in one weeks time -Continue Abilify 20 mg daily -Continue Ativan to 1 mg nightly -Total cholesterol was 132 and hemoglobin A1c was 5.4 - EKG showed no prolongation of QTc  Right Hand x-ray negative for any fracture  Social/Dispo -Pt lives [facility] [employer] [facility]. Dr Johnella MoloneyMcNew spoke with his mother [name] and she [place] Dr Johnella MoloneyMcNew also spoke with his Brita Rompunt Caroline and his Royanne [place] advisor, [name] at [employer] He has not needed any when necessary Ativan since November 8. -We will need psychotropic medication management follow-up appointment as well as individual therapy after discharge. [facility] by the psychiatrist.   Levora AngelKAPUR,Toby Breithaupt KAMAL, MD 07/17/2017, 9:02 AM

## 2017-07-17 NOTE — Progress Notes (Signed)
CH responded to request to offer prayer for the patient. Patient wanted to talk about being discharged as he would like to talk to his religious leader as he believes that he is a prophet. Patient is experiencing grief over the recent loss of loved ones. Patient stated that he is bipolar and was manic and his friends were concerned for him. He is concerned for himself as he wants to learn to manage his mental care. CH offered emotional support and patient led a prayer.

## 2017-07-17 NOTE — [facility] Group Notes (Signed)
07/17/2017 1:15am  Type of Therapy and Topic:  Group Therapy:  Cognitive Distortions  Participation Level:  Active   Description of Group:    Patients in this group will be introduced to the topic of cognitive distortions.  Patients will identify and describe cognitive distortions, describe the feelings these distortions create for them.  Patients will identify one or more situations in their personal life where they have cognitively distorted thinking and will verbalize challenging this cognitive distortion through positive thinking skills.  Patients will practice the skill of using positive affirmations to challenge cognitive distortions using affirmation cards.    Therapeutic Goals:  1. Patient will identify two or more cognitive distortions they have used 2. Patient will identify one or more emotions that stem from use of a cognitive distortion 3. Patient will demonstrate use of a positive affirmation to counter a cognitive distortion through discussion and/or role play. 4. Patient will describe one way cognitive distortions can be detrimental to wellness   Summary of Patient Progress:  Patient reports that his unhelpful thinking is labeling himself and other people. He states that when he makes a mistake, he tends to label himself. Patient shared some of his distorted thinking about his family and was able to process and challenge unhelpful thoughts. Patient fully engaged in group discussion.      Therapeutic Modalities:   Cognitive Behavioral Therapy Motivational Interviewing   Jamaul Heist  CUEBAS-COLON, LCSW 07/17/2017 2:45 PM

## 2017-07-18 NOTE — Plan of Care (Signed)
Patient is talkative but less intrusive today.Denies SI,HI and AVH.Compliant with medications.Attended groups.Appetite and energy level good.Support and encouragement given.

## 2017-07-18 NOTE — Progress Notes (Signed)
Recreation Therapy Notes   Date: 11.12.18  Time: 3:00pm  Location: Craft room  Behavioral response: Appropriate  Group Type: Craft  Participation level: Moderate  Communication: Patient was social with peers and staff.  Comments: Patient left group early due to unknown reasons and never returned.  Melvin Drake LRT/CTRS        [name] 07/18/2017 3:52 PM

## 2017-07-18 NOTE — Progress Notes (Signed)
Recreation Therapy Notes  Date: 11.12.18  Time: 9:30 am   Location: Craft Room  Behavioral response: Appropriate  Intervention Topic: Creative Expressions  Discussion/Intervention: Group content on today was focused on creative expressions. The group defined creative expressions and ways it can be used. Patients identified ways to express themselves and what they normally use to express themselves. Individuals described if they normally use creative expressions and ways they can positively express themselves in the future. The group identified why it is important to express themselves. Individual participated in the intervention "Origami Butterfly" where they had a chance to make a butterfly using the origami method and paper.  Clinical Observations/Feedback:  Patient came to group and expressed that creative expression is feeling comfortable and expressing yourself. He explained that he likes dancing to express himself. Individual described that he normally uses creative expressions when he is sad or upset. Patient also identified creative expressions can be used to get out feeling and be yourself. Individual was focused on task and topic during group and needed no redirection from staff.   Kelsie Zaborowski LRT/CTRS         Cornel Werber 07/18/2017 11:16 AM

## 2017-07-18 NOTE — Tx Team (Signed)
Interdisciplinary Treatment and Diagnostic Plan Update  07/18/2017 Time of Session: 11am Melvin Drake MRN: [id number]  Principal Diagnosis: Bipolar 1 disorder, manic, moderate (HCC)  Secondary Diagnoses: Principal Problem:   Bipolar 1 disorder, manic, moderate (HCC)   Current Medications:  Current Facility-Administered Medications  Medication Dose Route Frequency Provider Last Rate Last Dose  . acetaminophen (TYLENOL) tablet 650 mg  650 mg Oral Q6H PRN Haskell RilingMcNew, Holly R, MD   650 mg at 07/13/17 2122  . alum & mag hydroxide-simeth (MAALOX/MYLANTA) 200-200-20 MG/5ML suspension 30 mL  30 mL Oral Q4H PRN Clapacs, John T, MD      . ARIPiprazole (ABILIFY) tablet 20 mg  20 mg Oral Daily McNew, Ileene HutchinsonHolly R, MD   20 mg at 07/18/17 0813  . diphenhydrAMINE (BENADRYL) capsule 50 mg  50 mg Oral Q6H PRN Pucilowska, Jolanta B, MD   50 mg at 07/13/17 2240   Or  . diphenhydrAMINE (BENADRYL) injection 50 mg  50 mg Intramuscular Q6H PRN Pucilowska, Jolanta B, MD      . divalproex (DEPAKOTE ER) 24 hr tablet 750 mg  750 mg Oral QHS Darliss RidgelKapur, Aarti K, MD   750 mg at 07/17/17 2200  . haloperidol (HALDOL) tablet 10 mg  10 mg Oral Q8H PRN Pucilowska, Jolanta B, MD   10 mg at 07/13/17 2216   Or  . haloperidol lactate (HALDOL) injection 10 mg  10 mg Intramuscular Q8H PRN Pucilowska, Jolanta B, MD      . LORazepam (ATIVAN) tablet 2 mg  2 mg Oral Q6H PRN Pucilowska, Jolanta B, MD   2 mg at 07/14/17 0902   Or  . LORazepam (ATIVAN) injection 2 mg  2 mg Intramuscular Q6H PRN Pucilowska, Jolanta B, MD      . LORazepam (ATIVAN) tablet 1 mg  1 mg Oral Q4H PRN McNew, Ileene HutchinsonHolly R, MD   1 mg at 07/17/17 1531  . LORazepam (ATIVAN) tablet 1 mg  1 mg Oral QHS McNew, Ileene HutchinsonHolly R, MD   1 mg at 07/17/17 2201  . magnesium hydroxide (MILK OF MAGNESIA) suspension 30 mL  30 mL Oral Daily PRN Clapacs, Jackquline DenmarkJohn T, MD       PTA Medications: No medications prior to admission.    Patient Stressors: Health problems Substance abuse  Patient  Strengths: Average or above average intelligence Capable of independent living Religious Affiliation  Treatment Modalities: Medication Management, Group therapy, Case management,  1 to 1 session with clinician, Psychoeducation, Recreational therapy.   Physician Treatment Plan for Primary Diagnosis: Bipolar 1 disorder, manic, moderate (HCC) Long Term Goal(s): Improvement in symptoms so as ready for discharge Improvement in symptoms so as ready for discharge   Short Term Goals: Ability to identify changes in lifestyle to reduce recurrence of condition will improve Ability to verbalize feelings will improve Ability to demonstrate self-control will improve Ability to demonstrate self-control will improve Ability to identify and develop effective coping behaviors will improve Compliance with prescribed medications will improve  Medication Management: Evaluate patient's response, side effects, and tolerance of medication regimen.  Therapeutic Interventions: 1 to 1 sessions, Unit Group sessions and Medication administration.  Evaluation of Outcomes: Progressing  Physician Treatment Plan for Secondary Diagnosis: Principal Problem:   Bipolar 1 disorder, manic, moderate (HCC)  Long Term Goal(s): Improvement in symptoms so as ready for discharge Improvement in symptoms so as ready for discharge   Short Term Goals: Ability to identify changes in lifestyle to reduce recurrence of condition will improve Ability to verbalize feelings will  improve Ability to demonstrate self-control will improve Ability to demonstrate self-control will improve Ability to identify and develop effective coping behaviors will improve Compliance with prescribed medications will improve     Medication Management: Evaluate patient's response, side effects, and tolerance of medication regimen.  Therapeutic Interventions: 1 to 1 sessions, Unit Group sessions and Medication administration.  Evaluation of Outcomes:  Progressing   RN Treatment Plan for Primary Diagnosis: Bipolar 1 disorder, manic, moderate (HCC) Long Term Goal(s): Knowledge of disease and therapeutic regimen to maintain health will improve  Short Term Goals: Ability to participate in decision making will improve, Ability to identify and develop effective coping behaviors will improve and Compliance with prescribed medications will improve  Medication Management: RN will administer medications as ordered by provider, will assess and evaluate patient's response and provide education to patient for prescribed medication. RN will report any adverse and/or side effects to prescribing provider.  Therapeutic Interventions: 1 on 1 counseling sessions, Psychoeducation, Medication administration, Evaluate responses to treatment, Monitor vital signs and CBGs as ordered, Perform/monitor CIWA, COWS, AIMS and Fall Risk screenings as ordered, Perform wound care treatments as ordered.  Evaluation of Outcomes: Progressing   LCSW Treatment Plan for Primary Diagnosis: Bipolar 1 disorder, manic, moderate (HCC) Long Term Goal(s): Safe transition to appropriate next level of care at discharge, Engage patient in therapeutic group addressing interpersonal concerns.  Short Term Goals: Engage patient in aftercare planning with referrals and resources, Increase emotional regulation, Facilitate acceptance of mental health diagnosis and concerns, Identify triggers associated with mental health/substance abuse issues and Increase skills for wellness and recovery  Therapeutic Interventions: Assess for all discharge needs, 1 to 1 time with Social worker, Explore available resources and support systems, Assess for adequacy in community support network, Educate family and significant other(s) on suicide prevention, Complete Psychosocial Assessment, Interpersonal group therapy.  Evaluation of Outcomes: Progressing   Progress in Treatment: Attending groups:  Yes. Participating in groups: Yes. Taking medication as prescribed: Yes. Toleration medication: Yes. Family/Significant other contact made: No, will contact:   mother Patient understands diagnosis: Yes. Discussing patient identified problems/goals with staff: Yes. Medical problems stabilized or resolved: Yes. Denies suicidal/homicidal ideation: Yes. Issues/concerns per patient self-inventory: No. Other:    New problem(s) identified: No, Describe:     New Short Term/Long Term Goal(s): to be discharged  Discharge Plan or Barriers:  Discharge back to school and follow up at school counseling center. Pt much improved, however after family meeting today it was agreed that Pt is not at baseline.  Reason for Continuation of Hospitalization: Anxiety Mania Medication stabilization Other; describe Coordination of Aftercare  Estimated Length of Stay: 5-7 days  Attendees: Patient: 07/18/2017 2:28 PM  Physician: Corinna GabHolly McNew, MD 07/18/2017 2:28 PM  Nursing: Leonia ReaderPhyllis Cobb, RN 07/18/2017 2:28 PM  RN Care Manager: 07/18/2017 2:28 PM  Social Worker: Jake SharkSara Ronneisha Jett, LCSW 07/18/2017 2:28 PM  Recreational Therapist: Garret ReddishShay Outlaw, LRT 07/18/2017 2:28 PM  Other:  07/18/2017 2:28 PM  Other:  07/18/2017 2:28 PM  Other: 07/18/2017 2:28 PM    Scribe for Treatment Team: [name] Lanay Zinda, LCSW 07/18/2017 2:28 PM

## 2017-07-18 NOTE — Progress Notes (Addendum)
[facility] MD Progress Note  07/18/2017 12:42 PM Melvin Drake  MRN:  [id number] Subjective:  Pt continued to be manic over the weekend. He was euphoric, intrusive, flirting excessively with students, very labile at times. We had family meeting today with his mother and brother who are in town visiting. They felt that he is slightly improved but still "very talkative and not able to process things." Pt met with [place] as well. He was very argumentative. He had periods of agitation and irritable during meeting which is very out of character for him per his family. Pt is hyperverbal and labile through meeting. He initially states that he wants to go back to [place] with his family but then states that he wants to go to [place] to continue taking classes. He is unable to fully commit to what he wants yet. He states, "I'm just saying what I can to get out of here." He is sleeping slightly better. He has been medication compliant. Principal Problem: Bipolar 1 disorder, manic, moderate (HCC) Diagnosis:   Patient Active Problem List   Diagnosis Date Noted  . Bipolar 1 disorder, manic, moderate (HCC) [F31.12] 07/11/2017   Total Time spent with patient: 20 minutes plus 20 minutes with family   Past Psychiatric History: See H&P  Past Medical History: History reviewed. No pertinent past medical history. History reviewed. No pertinent surgical history. Family History: History reviewed. No pertinent family history. Family Psychiatric  History: See H&P Social History:  Social History   Substance and Sexual Activity  Alcohol Use Yes   Comment: occas.     Social History   Substance and Sexual Activity  Drug Use Yes  . Types: Marijuana   Comment: not recently-for about 3 weeks now    Social History   Socioeconomic History  . Marital status: Single    Spouse name: None  . Number of children: None  . Years of education: None  . Highest education level: None  Social Needs  . Financial resource strain: None   . Food insecurity - worry: None  . Food insecurity - inability: None  . Transportation needs - medical: None  . Transportation needs - non-medical: None  Occupational History  . None  Tobacco Use  . Smoking status: Never Smoker  . Smokeless tobacco: Never Used  Substance and Sexual Activity  . Alcohol use: Yes    Comment: occas.  . Drug use: Yes    Types: Marijuana    Comment: not recently-for about 3 weeks now  . Sexual activity: None  Other Topics Concern  . None  Social History Narrative  . None    Sleep: Improving  Appetite:  Fair  Current Medications: Current Facility-Administered Medications  Medication Dose Route Frequency Provider Last Rate Last Dose  . acetaminophen (TYLENOL) tablet 650 mg  650 mg Oral Q6H PRN Marylin Crosby, MD   650 mg at 07/13/17 2122  . alum & mag hydroxide-simeth (MAALOX/MYLANTA) 200-200-20 MG/5ML suspension 30 mL  30 mL Oral Q4H PRN Clapacs, John T, MD      . ARIPiprazole (ABILIFY) tablet 20 mg  20 mg Oral Daily McNew, Tyson Babinski, MD   20 mg at 07/18/17 0813  . diphenhydrAMINE (BENADRYL) capsule 50 mg  50 mg Oral Q6H PRN Pucilowska, Jolanta B, MD   50 mg at 07/13/17 2240   Or  . diphenhydrAMINE (BENADRYL) injection 50 mg  50 mg Intramuscular Q6H PRN Pucilowska, Jolanta B, MD      . divalproex (DEPAKOTE ER)  24 hr tablet 750 mg  750 mg Oral QHS Chauncey Mann, MD   750 mg at 07/17/17 2200  . haloperidol (HALDOL) tablet 10 mg  10 mg Oral Q8H PRN Pucilowska, Jolanta B, MD   10 mg at 07/13/17 2216   Or  . haloperidol lactate (HALDOL) injection 10 mg  10 mg Intramuscular Q8H PRN Pucilowska, Jolanta B, MD      . LORazepam (ATIVAN) tablet 2 mg  2 mg Oral Q6H PRN Pucilowska, Jolanta B, MD   2 mg at 07/14/17 0902   Or  . LORazepam (ATIVAN) injection 2 mg  2 mg Intramuscular Q6H PRN Pucilowska, Jolanta B, MD      . LORazepam (ATIVAN) tablet 1 mg  1 mg Oral Q4H PRN McNew, Tyson Babinski, MD   1 mg at 07/17/17 1531  . LORazepam (ATIVAN) tablet 1 mg  1 mg Oral QHS  McNew, Tyson Babinski, MD   1 mg at 07/17/17 2201  . magnesium hydroxide (MILK OF MAGNESIA) suspension 30 mL  30 mL Oral Daily PRN Clapacs, Madie Reno, MD        Lab Results: No results found for this or any previous visit (from the past 48 hour(s)).  Blood Alcohol level:  Lab Results  Component Value Date   ETH <10 84/13/2440    Metabolic Disorder Labs: Lab Results  Component Value Date   HGBA1C 5.4 07/12/2017   MPG 108.28 07/12/2017   No results found for: PROLACTIN Lab Results  Component Value Date   CHOL 132 07/12/2017   TRIG 40 07/12/2017   HDL 54 07/12/2017   CHOLHDL 2.4 07/12/2017   VLDL 8 07/12/2017   LDLCALC 70 07/12/2017    Physical Findings: AIMS:  , ,  ,  ,    CIWA:  CIWA-Ar Total: 0 COWS:     Musculoskeletal: Strength & Muscle Tone: within normal limits Gait & Station: normal Patient leans: N/A  Psychiatric Specialty Exam: Physical Exam  Nursing note and vitals reviewed.   Review of Systems  All other systems reviewed and are negative.   Blood pressure (!) 127/96, pulse 66, temperature 97.8 F (36.6 C), resp. rate 18, height 5' 8" (1.727 m), weight 74.8 kg (165 lb), SpO2 100 %.Body mass index is 25.09 kg/m.  General Appearance: Casual  Eye Contact:  Good  Speech:  Pressured  Volume:  Increased  Mood:  Irritable  Affect:  Labile  Thought Process:  Disorganized  Orientation:  Full (Time, Place, and Person)  Thought Content:  Paranoid Ideation  Suicidal Thoughts:  No  Homicidal Thoughts:  No  Memory:  Immediate;   Fair  Judgement:  Poor  Insight:  Lacking  Psychomotor Activity:  Increased  Concentration:  Concentration: Fair  Recall:  [employer] of Knowledge:  Fair  Language:  Fair  Akathisia:  No      Assets:  Communication Skills Desire for Improvement  ADL's:  Intact  Cognition:  WNL  Sleep:  Number of Hours: 6.5     Treatment Plan Summary: 21 yo male who was admitted due to mania. He is showing some improvements but still presenting as  manic. He was started on Depakote over the weekend which I think is a good choice. He was previously refusing this but now has been taking it. Family meeting held today and family feels he is still not at baseline.  Bipolar I disorder -Continue Abilify 20 mg daily -Continue Depakote ER 750 mg qhs -Continue Ativan 1 mg qhs  Social/Dispo -Pt lives on [address] [facility] [address] [facility] [address] and will likely return there. Family meeting held today with his mother and brother. CSW on board to schedule follow up  Greater than 50% of face to face time with patient was spent on counseling and coordination of care. We discussed medications and discharge planning, Family meeting held with pt, his mother, and his brother Marylin Crosby, MD 07/18/2017, 12:42 PM

## 2017-07-18 NOTE — Progress Notes (Signed)
Patient ID: Melvin Drake, male   DOB: 01-31-1996, 21 y.o.   MRN: [id number] CSW Aylee Littrell and Eulas Post, Dr. Wonda Olds met with Pt, his mother Melvin Drake and brother [name].  Discussed Pt's desire to be discharged and all parties made Pt aware that we did not believe he was at baseline yet and would need to stay a little longer.  Pt disappointed and frustrated.  Family responded well and seemed to have realistic expectations of Pt.  Verbalized support of his choice to discharge home or to school.  Pt said initially he would discharge home when he thought this would allow him to be discharged today.  After this was clarified he verbalizes that he wants to be discharged back to school to finish out the school year and see a Social worker and psychiatrist through [facility].  All parties agreed to keep in touch and evaluate progress day to day.  CSW provided direct phone number.  MD provided education on Pt's current medication regimen.  Dossie Arbour, LCSW

## 2017-07-18 NOTE — [facility] Group Notes (Signed)
[facility] LCSW Group Therapy Note  Date/Time:07/18/17, 1300  Type of Therapy and Topic:  Group Therapy:  Overcoming Obstacles  Participation Level:  Did not attend  Description of Group:    In this group patients will be encouraged to explore what they see as obstacles to their own wellness and recovery. They will be guided to discuss their thoughts, feelings, and behaviors related to these obstacles. The group will process together ways to cope with barriers, with attention given to specific choices patients can make. Each patient will be challenged to identify changes they are motivated to make in order to overcome their obstacles. This group will be process-oriented, with patients participating in exploration of their own experiences as well as giving and receiving support and challenge from other group members.  Therapeutic Goals: 1. Patient will identify personal and current obstacles as they relate to admission. 2. Patient will identify barriers that currently interfere with their wellness or overcoming obstacles.  3. Patient will identify feelings, thought process and behaviors related to these barriers. 4. Patient will identify two changes they are willing to make to overcome these obstacles:    Summary of Patient Progress      Therapeutic Modalities:   Cognitive Behavioral Therapy Solution Focused Therapy Motivational Interviewing Relapse Prevention Therapy  Daleen SquibbGreg Mekenna Finau, LCSW

## 2017-07-18 NOTE — Social Work (Signed)
Call from Molli PoseyGeraldine Sayre [phone]) - states she is in [place] today and will leave this afternoon.  Wants family meeting w CSW and MD to discuss care needs for son going forward.  States she has left "two messages for CSW."  CSW explained that she had made several calls to Ms Kathleen LimeDyson and had not received return call, [facility] not [facility] but will pass on request to CSW Laws for her response.  Also notified mother of scheduled meds mgmt appt w Dr Daleen Boavi on 11/15 which will need to be rescheduled if patient does not discharge in time.  Mother expects call today and hopes to participate in family meeting today.   Santa GeneraAnne Toya Palacios, LCSW Lead Clinical Social Worker Phone:  [phone]

## 2017-07-18 NOTE — H&P (Signed)
Visible in the milieu, is intrusive and hypomanic. Asked male nurse to teach him to shave. Requested info on depakote. This was provided. Took HS meds and went to bed. Appears to be asleep at this time. No physical complaints. Remains on routine obs for safety.

## 2017-07-19 NOTE — Plan of Care (Signed)
Patient slept for Estimated Hours of 7.50; Precautionary checks every 15 minutes for safety maintained, room free of safety hazards, patient sustains no injury or falls during this shift.

## 2017-07-19 NOTE — [facility] Group Notes (Signed)
[facility] Group Notes:  (Nursing/MHT/Case Management/Adjunct)  Date:  07/19/2017  Time:  2:50 PM  Type of Therapy:  Psychoeducational Skills  Participation Level:  Active  Participation Quality:  Appropriate and Sharing  Affect:  Appropriate  Cognitive:  Appropriate and Oriented  Insight:  Appropriate  Engagement in Group:  Engaged and Supportive  Modes of Intervention:  Discussion and Education  Summary of Progress/Problems:  Melvin Drake M Melvin Drake 07/19/2017, 2:50 PM

## 2017-07-19 NOTE — Plan of Care (Signed)
Patient remains  manic  with disease process  Understanding of disease process , Attending Treatment  Team . Family session for preparation  for discharge ,no injuries this admission . Voice no concerns around  sleep  voice no concern with urinary retention

## 2017-07-19 NOTE — Progress Notes (Signed)
Recreation Therapy Notes   Date: 11.13.18  Time: 9:30 am   Location: Craft Room  Behavioral response: Appropriate  Intervention Topic: Stress  Discussion/Intervention:  Group content on today was focused on stress. The group defined stress and way to cope with stress. Participants expressed how they know when they are stresses out. Individuals described the different ways they have to cope with stress. The group stated reasons why it is important to cope with stress. Patient explained what good stress is and some examples. The group participated in the intervention "Stress Free Board", where patients made their own board with ways they can decrease stress. Clinical Observations/Feedback:  Patient came to group and described one of his main stressors as being lied on or lied to. The patient expressed that he deals with stress by running, talking to others and using a stress ball. Individual participated in the intervention and was on topic/task. Patient left group early and stated he was tired.   Melvin Drake LRT/CTRS         Melvin Drake 07/19/2017 10:21 AM

## 2017-07-19 NOTE — Progress Notes (Signed)
Patient ID: Melvin Drake, male   DOB: Sep 24, 1995, 21 y.o.   MRN: [id number] Less manic, intrusive still, entitled still, bizarre appearance (tucked-in the scrub), in denial "I want to file a grievance because I have been held here against my consent." Pt is not as erratic or disruptive; attended the wrap up group, complied with medications at bedtime.

## 2017-07-19 NOTE — Progress Notes (Signed)
Recreation Therapy Notes  Date: 11.13.18  Time: 3:00pm  Location: Craft room  Behavioral response: Appropriate   Group Type: Craft  Participation level: Active  Communication: Patient was social with peers and staff during group.  Comments: N/A  Orvan Papadakis LRT/CTRS        Makenleigh Crownover 07/19/2017 4:29 PM 

## 2017-07-19 NOTE — Progress Notes (Signed)
D: Remained manic this shift  Very needy .  Patient stated slept good last night .Stated appetite is good and energy level  Is normal. Stated concentration is good . Stated on Depression scale0 , hopeless 0 and anxiety 0 .( low 0-10 high) Denies suicidal  homicidal ideations  .  No auditory hallucinations  No pain concerns . Appropriate ADL'S. Interacting with peers and staff intrusive at times    Attending unit programing  Affect  Cheerful on approach. A: Encourage patient participation with unit programming . Instruction  Given on  Medication , verbalize understanding. R: Voice no other concerns. Staff continue to monitor

## 2017-07-19 NOTE — [facility] Group Notes (Signed)
LCSW Group Therapy Note 07/19/2017 9:00am  Type of Therapy and Topic:  Group Therapy:  Setting Goals  Participation Level:  Active  Description of Group: In this process group, patients discussed using strengths to work toward goals and address challenges.  Patients identified two positive things about themselves and one goal they were working on.  Patients were given the opportunity to share openly and support each other's plan for self-empowerment.  The group discussed the value of gratitude and were encouraged to have a daily reflection of positive characteristics or circumstances.  Patients were encouraged to identify a plan to utilize their strengths to work on current challenges and goals.  Therapeutic Goals 1. Patient will verbalize personal strengths/positive qualities and relate how these can assist with achieving desired personal goals 2. Patients will verbalize affirmation of peers plans for personal change and goal setting 3. Patients will explore the value of gratitude and positive focus as related to successful achievement of goals 4. Patients will verbalize a plan for regular reinforcement of personal positive qualities and circumstances.  Summary of Patient Progress:  Pt much improved.  Presented less grandiose, waited his turn to contribute to roup discussion,  Contributed appropriately.  His goal was to continue daily self-care like eating, taking meds and getting proper amount of rest.  Prior to group he approached CSW in an appropriate way to request talking about discharge follow up appointments.     Therapeutic Modalities Cognitive Behavioral Therapy Motivational Interviewing    [name] Flora Parks, LCSW 07/19/2017 11:03 AM

## 2017-07-19 NOTE — Progress Notes (Signed)
[facility]  07/19/2017 2:54 PM Maryruth Bunoah Hannibal Gavigan  MRN:  [id number] Subjective:  Pt is showing improvements in symptoms. He is much less intrusive on the unit. He has been attending groups and participating well. He is still argumentative with his provider but much less hyperverbal and much better able to process information rationally. He denies SI or thoughts of self harm. He continues to state that he wants to continue classes at [employer]. He is sleeping better. He states, "I think I'm doing great." He has been medication compliant Principal Problem: Bipolar 1 disorder, manic, moderate (HCC) Diagnosis:   Patient Active Problem List   Diagnosis Date Noted  . Bipolar 1 disorder, manic, moderate (HCC) [F31.12] 07/11/2017   Total Time spent with patient: 20 minutes  Past Psychiatric History: See H&P  Past Medical History: History reviewed. No pertinent past medical history. History reviewed. No pertinent surgical history. Family History: History reviewed. No pertinent family history. Family Psychiatric  History: See H&P Social History:  Social History   Substance and Sexual Activity  Alcohol Use Yes   Comment: occas.     Social History   Substance and Sexual Activity  Drug Use Yes  . Types: Marijuana   Comment: not recently-for about 3 weeks now    Social History   Socioeconomic History  . Marital status: Single    Spouse name: None  . Number of children: None  . Years of education: None  . Highest education level: None  Social Needs  . Financial resource strain: None  . Food insecurity - worry: None  . Food insecurity - inability: None  . Transportation needs - medical: None  . Transportation needs - non-medical: None  Occupational History  . None  Tobacco Use  . Smoking status: Never Smoker  . Smokeless tobacco: Never Used  Substance and Sexual Activity  . Alcohol use: Yes    Comment: occas.  . Drug use: Yes    Types: Marijuana    Comment: not  recently-for about 3 weeks now  . Sexual activity: None  Other Topics Concern  . None  Social History Narrative  . None   Sleep: Improving  Appetite:  Good  Current Medications: Current Facility-Administered Medications  Medication Dose Route Frequency Provider Last Rate Last Dose  . acetaminophen (TYLENOL) tablet 650 mg  650 mg Oral Q6H PRN Haskell RilingMcNew, Kisa Fujii R, MD   650 mg at 07/13/17 2122  . alum & mag hydroxide-simeth (MAALOX/MYLANTA) 200-200-20 MG/5ML suspension 30 mL  30 mL Oral Q4H PRN Clapacs, John T, MD      . ARIPiprazole (ABILIFY) tablet 20 mg  20 mg Oral Daily Cyrilla Durkin, Ileene HutchinsonHolly R, MD   20 mg at 07/19/17 0813  . diphenhydrAMINE (BENADRYL) capsule 50 mg  50 mg Oral Q6H PRN Pucilowska, Jolanta B, MD   50 mg at 07/13/17 2240   Or  . diphenhydrAMINE (BENADRYL) injection 50 mg  50 mg Intramuscular Q6H PRN Pucilowska, Jolanta B, MD      . divalproex (DEPAKOTE ER) 24 hr tablet 750 mg  750 mg Oral QHS Darliss RidgelKapur, Aarti K, MD   750 mg at 07/18/17 2142  . haloperidol (HALDOL) tablet 10 mg  10 mg Oral Q8H PRN Pucilowska, Jolanta B, MD   10 mg at 07/13/17 2216   Or  . haloperidol lactate (HALDOL) injection 10 mg  10 mg Intramuscular Q8H PRN Pucilowska, Jolanta B, MD      . LORazepam (ATIVAN) tablet 2 mg  2 mg Oral  Q6H PRN Kristine LineaPucilowska, Jolanta B, MD   2 mg at 07/14/17 0902   Or  . LORazepam (ATIVAN) injection 2 mg  2 mg Intramuscular Q6H PRN Pucilowska, Jolanta B, MD      . LORazepam (ATIVAN) tablet 1 mg  1 mg Oral Q4H PRN Johann Gascoigne, Ileene HutchinsonHolly R, MD   1 mg at 07/17/17 1531  . LORazepam (ATIVAN) tablet 1 mg  1 mg Oral QHS Namiah Dunnavant, Ileene HutchinsonHolly R, MD   1 mg at 07/18/17 2142  . magnesium hydroxide (MILK OF MAGNESIA) suspension 30 mL  30 mL Oral Daily PRN Clapacs, Jackquline DenmarkJohn T, MD        Lab Results: No results found for this or any previous visit (from the past 48 hour(s)).  Blood Alcohol level:  Lab Results  Component Value Date   ETH <10 07/11/2017    Metabolic Disorder Labs: Lab Results  Component Value Date    HGBA1C 5.4 07/12/2017   MPG 108.28 07/12/2017   No results found for: PROLACTIN Lab Results  Component Value Date   CHOL 132 07/12/2017   TRIG 40 07/12/2017   HDL 54 07/12/2017   CHOLHDL 2.4 07/12/2017   VLDL 8 07/12/2017   LDLCALC 70 07/12/2017    Physical Findings: AIMS:  , ,  ,  ,    CIWA:  CIWA-Ar Total: 0 COWS:     Musculoskeletal: Strength & Muscle Tone: within normal limits Gait & Station: normal Patient leans: N/A  Psychiatric Specialty Exam: Physical Exam  Nursing note and vitals reviewed.   ROS  Blood pressure 123/85, pulse (!) 57, temperature 97.8 F (36.6 C), temperature source Oral, resp. rate 18, height 5\' 8"  (1.727 m), weight 74.8 kg (165 lb), SpO2 100 %.Body mass index is 25.09 kg/m.  General Appearance: Casual  Eye Contact:  Good  Speech:  Clear and Coherent  Volume:  Normal  Mood:  Euthymic  Affect:  Congruent  Thought Process:  Goal Directed  Orientation:  Full (Time, Place, and Person)  Thought Content:  Negative  Suicidal Thoughts:  No  Homicidal Thoughts:  No  Memory:  Immediate;   Fair  Judgement:  Fair  Insight:  Lacking  Psychomotor Activity:  Normal  Concentration:  Concentration: Fair  Recall:  [employer]  Fund of Knowledge:  Fair  Language:  Fair  Akathisia:  No      Assets:  Communication Skills Desire for Improvement  ADL's:  Intact  Cognition:  WNL  Sleep:  Number of Hours: 6.5     Treatment Plan Summary: 21 yo male admitted due to acute manic episode. He has been showing significant improvements and is much less intrusive and sleeping much better. He is medication compliant. Family meeting held yesterday and family felt that he was not quite at baseline but much better than on admission. Pt plans to return to [employer] on discharge.  Plan:  Bipolar I disorder -Continue Abilify 20 mg daily -Continue Depakote 750 mg qhs -Will check Depakote level on Thursday  Dispo/Social -[facility]. He will be set  up with follow up on campus.     Haskell RilingHolly R Yanett Conkright, MD 07/19/2017, 2:54 PM

## 2017-07-19 NOTE — [facility] Group Notes (Signed)
[facility] Group Notes:  (Nursing/MHT/Case Management/Adjunct)  Date:  07/19/2017  Time:  9:21 PM  Type of Therapy:  Evening Wrap-up Group  Participation Level:  Active  Participation Quality:  Appropriate and Attentive  Affect:  Appropriate  Cognitive:  Alert and Appropriate  Insight:  Appropriate, Good and Improving  Engagement in Group:  Developing/Improving and Engaged  Modes of Intervention:  Discussion  Summary of Progress/Problems:  Melvin MorrowChelsea Nanta [name] 07/19/2017, 9:21 PM

## 2017-07-20 NOTE — [facility] Group Notes (Signed)
  [facility] LCSW Group Therapy Note  Date/Time: 07/20/17, 0930  Type of Therapy/Topic:  Group Therapy:  Emotion Regulation  Participation Level:  Active   Mood:  Description of Group:    The purpose of this group is to assist patients in learning to regulate negative emotions and experience positive emotions. Patients will be guided to discuss ways in which they have been vulnerable to their negative emotions. These vulnerabilities will be juxtaposed with experiences of positive emotions or situations, and patients challenged to use positive emotions to combat negative ones. Special emphasis will be placed on coping with negative emotions in conflict situations, and patients will process healthy conflict resolution skills.  Therapeutic Goals: 1. Patient will identify two positive emotions or experiences to reflect on in order to balance out negative emotions:  2. Patient will label two or more emotions that they find the most difficult to experience:  3. Patient will be able to demonstrate positive conflict resolution skills through discussion or role plays:   Summary of Patient Progress: Pt identified sadness as an emotion that is difficult for him to experience.  Pt was active in discussion of the negative consequences of being unable to control emotions and in looking for more positive ways to deal with negative emotions.       Therapeutic Modalities:   Cognitive Behavioral Therapy Feelings Identification Dialectical Behavioral Therapy  Daleen SquibbGreg Redmond Whittley, LCSW

## 2017-07-20 NOTE — Progress Notes (Signed)
D: Patient alert and oriented. Affect/mood: Pleasant, assertive, hyperactive however redirectable.  Denies SI, HI, AVH at this time. Denies pain. Goal: "maintaining my mental wellness so I can be discharged". Reports that in order to meet this goal he will attend groups, take medications, and nap when needed.   A: Scheduled medications administered to patient per MD order. Support and encouragement provided. Routine safety checks conducted every 15 minutes. Patient informed to notify staff with problems or concerns. Encouraged to talk to staff if feelings of harm toward self or others arise. Patient agrees.  R: No adverse drug reactions noted. Patient contracts for safety at this time. Patient compliant with medications and treatment plan. Patient receptive, however maintains to be intrusive, assertive, and manic. Patient interacts well with others on the unit. Boundaries established and redirecting implemented when needed. Patient remains safe at this time.

## 2017-07-20 NOTE — Progress Notes (Signed)
[facility]  07/20/2017 10:23 AM Melvin RiedelNoah Clint LippsHannibal Drake  MRN:  [id number]   Subjective:  Pt continues to show improvement in manic symptoms. He is still somewhat intrusive to the RN station at nighttime and dancing in the hallways. This morning, he is calm, organized, and goal directed. He is able to process through a conversation much better. He is attending groups and states that he continues to learn a lot in them. He states that he has learned about preventative care. When asked about this, he states that he needs to stay away from his ex-girlfriend and create boundaries because he knows this is not a good situation. He states that he also learned about conflict resolution.   I did speak with his mother today to give an update on his progress and she was grateful for this call. She is planning to come to town on Saturday.   Principal Problem: Bipolar 1 disorder, manic, moderate (HCC) Diagnosis:   Patient Active Problem List   Diagnosis Date Noted  . Bipolar 1 disorder, manic, moderate (HCC) [F31.12] 07/11/2017   Total Time spent with patient: 20 minutes  Past Psychiatric History: See H&P  Past Medical History: History reviewed. No pertinent past medical history. History reviewed. No pertinent surgical history. Family History: History reviewed. No pertinent family history. Family Psychiatric  History: See H&P Social History:  Social History   Substance and Sexual Activity  Alcohol Use Yes   Comment: occas.     Social History   Substance and Sexual Activity  Drug Use Yes  . Types: Marijuana   Comment: not recently-for about 3 weeks now    Social History   Socioeconomic History  . Marital status: Single    Spouse name: None  . Number of children: None  . Years of education: None  . Highest education level: None  Social Needs  . Financial resource strain: None  . Food insecurity - worry: None  . Food insecurity - inability: None  . Transportation needs - medical:  None  . Transportation needs - non-medical: None  Occupational History  . None  Tobacco Use  . Smoking status: Never Smoker  . Smokeless tobacco: Never Used  Substance and Sexual Activity  . Alcohol use: Yes    Comment: occas.  . Drug use: Yes    Types: Marijuana    Comment: not recently-for about 3 weeks now  . Sexual activity: None  Other Topics Concern  . None  Social History Narrative  . None   Sleep: Good  Appetite:  Good  Current Medications: Current Facility-Administered Medications  Medication Dose Route Frequency Provider Last Rate Last Dose  . acetaminophen (TYLENOL) tablet 650 mg  650 mg Oral Q6H PRN Haskell RilingMcNew, Lorimer Tiberio R, MD   650 mg at 07/13/17 2122  . alum & mag hydroxide-simeth (MAALOX/MYLANTA) 200-200-20 MG/5ML suspension 30 mL  30 mL Oral Q4H PRN Clapacs, John T, MD      . ARIPiprazole (ABILIFY) tablet 20 mg  20 mg Oral Daily Kabao Leite, Ileene HutchinsonHolly R, MD   20 mg at 07/20/17 0737  . diphenhydrAMINE (BENADRYL) capsule 50 mg  50 mg Oral Q6H PRN Pucilowska, Jolanta B, MD   50 mg at 07/13/17 2240   Or  . diphenhydrAMINE (BENADRYL) injection 50 mg  50 mg Intramuscular Q6H PRN Pucilowska, Jolanta B, MD      . divalproex (DEPAKOTE ER) 24 hr tablet 750 mg  750 mg Oral QHS Darliss RidgelKapur, Aarti K, MD   750 mg at  07/19/17 2139  . haloperidol (HALDOL) tablet 10 mg  10 mg Oral Q8H PRN Pucilowska, Jolanta B, MD   10 mg at 07/13/17 2216   Or  . haloperidol lactate (HALDOL) injection 10 mg  10 mg Intramuscular Q8H PRN Pucilowska, Jolanta B, MD      . LORazepam (ATIVAN) tablet 2 mg  2 mg Oral Q6H PRN Pucilowska, Jolanta B, MD   2 mg at 07/14/17 0902   Or  . LORazepam (ATIVAN) injection 2 mg  2 mg Intramuscular Q6H PRN Pucilowska, Jolanta B, MD      . LORazepam (ATIVAN) tablet 1 mg  1 mg Oral Q4H PRN Frans Valente, Ileene HutchinsonHolly R, MD   1 mg at 07/17/17 1531  . LORazepam (ATIVAN) tablet 1 mg  1 mg Oral QHS Patrick Salemi, Ileene HutchinsonHolly R, MD   1 mg at 07/19/17 2139  . magnesium hydroxide (MILK OF MAGNESIA) suspension 30 mL  30 mL Oral  Daily PRN Clapacs, Jackquline DenmarkJohn T, MD        Lab Results: No results found for this or any previous visit (from the past 48 hour(s)).  Blood Alcohol level:  Lab Results  Component Value Date   ETH <10 07/11/2017    Metabolic Disorder Labs: Lab Results  Component Value Date   HGBA1C 5.4 07/12/2017   MPG 108.28 07/12/2017   No results found for: PROLACTIN Lab Results  Component Value Date   CHOL 132 07/12/2017   TRIG 40 07/12/2017   HDL 54 07/12/2017   CHOLHDL 2.4 07/12/2017   VLDL 8 07/12/2017   LDLCALC 70 07/12/2017    Physical Findings: AIMS:  , ,  ,  ,    CIWA:  CIWA-Ar Total: 0 COWS:     Musculoskeletal: Strength & Muscle Tone: within normal limits Gait & Station: normal Patient leans: N/A  Psychiatric Specialty Exam: Physical Exam  ROS  Blood pressure 133/73, pulse 61, temperature 98 F (36.7 C), temperature source Oral, resp. rate 18, height 5\' 8"  (1.727 m), weight 74.8 kg (165 lb), SpO2 100 %.Body mass index is 25.09 kg/m.  General Appearance: Casual  Eye Contact:  Good  Speech:  Clear and Coherent  Volume:  Normal  Mood:  Euthymic  Affect:  Congruent  Thought Process:  Goal Directed  Orientation:  Full (Time, Place, and Person)  Thought Content:  Negative  Suicidal Thoughts:  No  Homicidal Thoughts:  No  Memory:  Immediate;   Fair  Judgement:  Impaired  Insight:  Lacking  Psychomotor Activity:  Negative  Concentration:  Concentration: Fair  Recall:  [employer]  Fund of Knowledge:  Fair  Language:  Fair  Akathisia:  No      Assets:  Communication Skills Desire for Improvement  ADL's:  Intact  Cognition:  WNL  Sleep:  Number of Hours: 5.3     Treatment Plan Summary: 21 yo male who was admitted for acute manic episode. Pt is showing significant improvements in symptoms and sleeping much better. He is tolerating medications well. His mother and brother plan to come to town this weekend.  Plan:  Bipolar I disorder -Continue Abilify 20 mg  daily -Continue Depakote 750 mg qhs -Check Depakote level tomorrow  Dispo/Social -[facility]. His mother is coming to town over the weekend. He will be set up with follow up on campus Haskell RilingHolly R Janita Camberos, MD 07/20/2017, 10:23 AM

## 2017-07-20 NOTE — Progress Notes (Signed)
Patient ID: Melvin Drake, male   DOB: August 20, 1996, 21 y.o.   MRN: [id number] Still hyperactive, quickly rebounding back to manic phase with mood (dancing in the hall way), intrusive, frequent to nurses' station with multiple requests; attended the wrap up group and participated fairly well, denied SI/HI/AVH, medication compliant.

## 2017-07-20 NOTE — [facility] Group Notes (Signed)
[facility] Group Notes:  (Nursing/MHT/Case Management/Adjunct)  Date:  07/20/2017  Time:  11:04 PM  Type of Therapy:  Psychoeducational Skills  Participation Level:  Minimal  Participation Quality:  Inattentive  Affect:  Anxious  Cognitive:  Lacking  Insight:  Lacking  Engagement in Group:  Distracting  Modes of Intervention:  Discussion and Education  Summary of Progress/Problems:  Melvin CompanionMary  [name] 07/20/2017, 11:04 PM

## 2017-07-20 NOTE — B[facility] Group Notes (Signed)
LCSW Group Therapy Note  07/20/2017 1:00pm    Type of Therapy and Topic:  Group Therapy:  Emotion Regulation    Participation Level:  Active  Description of Group:    The purpose of this group is to assist patients in learning to regulate negative emotions and experience positive emotions.  Patients will be guided to discuss ways in which they have been vulnerable to their negative emotions.  These vulnerabilities will be juxtaposed with experiences of positive emotions or situations, and patients will be challenged to use positive emotions to combat negative ones.  Special emphasis will be placed on coping with negative emotions in conflict situations, and patients will process healthy conflict resolution skills.   Therapeutic Goals: 1. Patient will identify two positive emotions or experiences to reflect on in order to balance out negative emotions. 2. Patient will label tow or more emotions that they find the most difficult to experience. 3. Patient will demonstrate positive conflict resolution skills through discussion and/or role plays   Summary of Patient Progress:  Melvin Drake was able to actively participate in process group. He identified several positive and negative emotions that he has experiences to include happy and humble.  He was able to process how he has used positive emotions to change his negative emotions and behaviors.  He was able to identify how he worked through conflict to have more positive resolutions.      Therapeutic Modalities:   Cognitive Behavioral Therapy Feelings Identification Dialectical Behavioral Therapy   Melvin [name] Theia Dezeeuw, LCSW 07/20/2017 5:05 PM

## 2017-07-20 NOTE — Plan of Care (Signed)
Patient slept for Estimated Hours of 5.30; Precautionary checks every 15 minutes for safety maintained, room free of safety hazards, patient sustains no injury or falls during this shift.  

## 2017-07-20 NOTE — B[facility] Group Notes (Signed)
[facility] Group Notes:  (Nursing/MHT/Case Management/Adjunct)  Date:  07/20/2017  Time:  9:17 PM  Type of Therapy:  Group Therapy  Participation Level:  Active  Participation Quality:  Redirectable  Affect:  Appropriate  Cognitive:  Alert  Insight:  Good  Engagement in Group:  Engaged  Modes of Intervention:  Support  Summary of Progress/Problems:  Melvin Drake 07/20/2017, 9:17 PM

## 2017-07-20 NOTE — Progress Notes (Signed)
  [facility] :  Will you be returning to the same living situation after discharge:  Yes,    At discharge, do you have transportation home?: Yes,    Do you have the ability to pay for your medications: Yes,     Release of information consent forms completed and in the chart;  Patient's signature needed at discharge.  Patient to Follow up at: Follow-up Information    [facility] Follow up on 08/04/2017.   Specialty:  Behavioral Health Why:  Initial appointment to begin medication managment with Dr. Daleen Boavi on 08/04/2017 11:00am Contact information: [address] [facility] [place] [id number] [phone]       [facility]. Go on 07/25/2017.   Why:  10:30am with Case manager Ryan to determine next steps/frequency of services etc. Contact information: [address] [place] [id number] [phone] Fax          Next level of care provider has access to [facility] Link:yes  Safety Planning and Suicide Prevention discussed: Yes,     Have you used any form of tobacco in the last 30 days? (Cigarettes, Smokeless Tobacco, Cigars, and/or Pipes): Yes  Has patient been referred to the Quitline?: Patient refused referral  Patient has been referred for addiction treatment: Yes  [name] Leatha Rohner, LCSW 07/20/2017, 2:17 PM

## 2017-07-20 NOTE — Plan of Care (Signed)
Anette Riedeloah was able to verbalize to this writer that he is aware of the importance of getting adequate sleep. Reports improved sleep hygiene without requiring sleep aid medications.

## 2017-07-21 ENCOUNTER — Ambulatory Visit: Payer: Self-pay | Admitting: Psychiatry

## 2017-07-21 LAB — VALPROIC ACID LEVEL: VALPROIC ACID LVL: 56 ug/mL (ref 50.0–100.0)

## 2017-07-21 MED ORDER — DIVALPROEX SODIUM ER 250 MG PO TB24: 750.0000 mg | ORAL_TABLET | Freq: Every day | ORAL | 1 refills | Status: DC

## 2017-07-21 MED ORDER — ARIPIPRAZOLE 20 MG PO TABS: 20.0000 mg | ORAL_TABLET | Freq: Every day | ORAL | 1 refills | Status: DC

## 2017-07-21 NOTE — Plan of Care (Signed)
Patient slept for Estimated Hours of 6.15; Precautionary checks every 15 minutes for safety maintained, room free of safety hazards, patient sustains no injury or falls during this shift.  

## 2017-07-21 NOTE — [facility] Group Notes (Signed)
[facility] Group Notes:  (Nursing/MHT/Case Management/Adjunct)  Date:  07/21/2017  Time:  7:29 PM  Type of Therapy:  Psychoeducational Skills  Participation Level:  Active  Participation Quality:  Appropriate, Attentive, Sharing and Supportive  Affect:  Appropriate  Cognitive:  Alert and Appropriate  Insight:  Appropriate, Good and Improving  Engagement in Group:  Developing/Improving and Engaged  Modes of Intervention:  Discussion, Education and Support  Summary of Progress/Problems:  Judene CompanionMary  Raesean Bartoletti 07/21/2017, 7:29 PM

## 2017-07-21 NOTE — Progress Notes (Signed)
D: Patient alert and oriented. Affect/mood: Assertive, invasive, hyperactive although redirectable. Denies SI, HI, AVH at this time. Denies pain. Goal: "to maintain my mental wellness". Plans to achieve this goal by providing self care, attend meetings, and have fun. Reports sleeping "fair", having "good" appetite, "normal" energy level, and "good" energy.   A: Scheduled medications administered to patient per MD order. Support and encouragement provided. Routine safety checks conducted every 15 minutes. Patient informed to notify staff with problems or concerns.  R: No adverse drug reactions noted. Patient contracts for safety at this time. Patient compliant with medications and treatment plan. Patient receptive, continues to remain hyperactive although is redirected easily. Remains cooperative at this time. Patient interacts well with others on the unit. Expresses excitement about soon being discharged from hospital. Patient remains safe at this time.

## 2017-07-21 NOTE — Progress Notes (Signed)
Patient ID: Melvin Drake, male   DOB: [birth year], 21 y.o.   MRN: [id number] A&Ox3, denied pain, interacting well with other patients, no hyperactivity behavior, still needy, grateful, came to the nurses' station multiple times to say farewell; anticipating discharge in the morning.

## 2017-07-21 NOTE — [facility] Group Notes (Signed)
[facility] Group Notes:  (Nursing/MHT/Case Management/Adjunct)  Date:  07/21/2017  Time:  9:33 PM  Type of Therapy:  Group Therapy  Participation Level:  Active  Participation Quality:  Appropriate  Affect:  Appropriate  Cognitive:  Alert  Insight:  Good  Engagement in Group:  Engaged  Modes of Intervention:  Support  Summary of Progress/Problems:  Melvin Drake 07/21/2017, 9:33 PM

## 2017-07-21 NOTE — Plan of Care (Signed)
Melvin Drake reports improved management of manic episodes. States that he is able to identify when his behavior increases above normal and expected limits. Expresses excitement about being discharged tomorrow.

## 2017-07-21 NOTE — Progress Notes (Signed)
Patient ID: Maryruth Bunoah Hannibal Kopke, male   DOB: March 22, 1996, 21 y.o.   MRN: [id number] High energy again, hyperactive, intrusive, manic, needy, no temper tantrums; intelligently dialogued but quickly defensive, "my mom is making me nervous, I am stressed out, she would not let me fly, drive without her in the the car, I want to do everything that I can possibly do now that I am young; I am nervous to be leaving in 2 days.." Encouraged, persuaded to listen to his mother. Patient came to me after snack time, "I am tripping, that lady is driving me crazy, I need my medication to let me chill out.." Medications given as ordered.

## 2017-07-21 NOTE — Progress Notes (Signed)
[facility]  07/21/2017 3:28 PM Melvin Drake  MRN:  [id number] Subjective:  Pt is overall showing great improvements. He is much more organized and able to process conversations. He is much less intrusive during the day. He is sleeping much better. He is no longer grandiose and speaking of a [employer] dollar company. He is feeling stressed out because he feels his mother is too worried about him. [facility] and is excited to get back to classes. He is able to state names and dosing off his medications and plans to continue these. He is able to verbalize why they are important to continue. He also knows the dates of his appointments with outpatient providers.   I spoke with his mother, Melvin Drake to discuss discharge. She is okay with him going tomorrow and plans to get to town to see him and bring him to [place] for [place] break.   Principal Problem: Bipolar 1 disorder, manic, moderate (HCC) Diagnosis:   Patient Active Problem List   Diagnosis Date Noted  . Bipolar 1 disorder, manic, moderate (HCC) [F31.12] 07/11/2017   Total Time spent with patient: 20 minutes  Past Psychiatric History: See H&P  Past Medical History: History reviewed. No pertinent past medical history. History reviewed. No pertinent surgical history. Family History: History reviewed. No pertinent family history. Family Psychiatric  History: See H&P Social History:  Social History   Substance and Sexual Activity  Alcohol Use Yes   Comment: occas.     Social History   Substance and Sexual Activity  Drug Use Yes  . Types: Marijuana   Comment: not recently-for about 3 weeks now    Social History   Socioeconomic History  . Marital status: Single    Spouse name: None  . Number of children: None  . Years of education: None  . Highest education level: None  Social Needs  . Financial resource strain: None  . Food insecurity - worry: None  . Food insecurity - inability: None   . Transportation needs - medical: None  . Transportation needs - non-medical: None  Occupational History  . None  Tobacco Use  . Smoking status: Never Smoker  . Smokeless tobacco: Never Used  Substance and Sexual Activity  . Alcohol use: Yes    Comment: occas.  . Drug use: Yes    Types: Marijuana    Comment: not recently-for about 3 weeks now  . Sexual activity: None  Other Topics Concern  . None  Social History Narrative  . None   Additional Social History:                         Sleep: Good  Appetite:  Good  Current Medications: Current Facility-Administered Medications  Medication Dose Route Frequency Provider Last Rate Last Dose  . acetaminophen (TYLENOL) tablet 650 mg  650 mg Oral Q6H PRN Haskell RilingMcNew, Donn Wilmot R, MD   650 mg at 07/13/17 2122  . alum & mag hydroxide-simeth (MAALOX/MYLANTA) 200-200-20 MG/5ML suspension 30 mL  30 mL Oral Q4H PRN Clapacs, John T, MD      . ARIPiprazole (ABILIFY) tablet 20 mg  20 mg Oral Daily Tavionna Grout, Ileene HutchinsonHolly R, MD   20 mg at 07/21/17 0750  . diphenhydrAMINE (BENADRYL) capsule 50 mg  50 mg Oral Q6H PRN Pucilowska, Jolanta B, MD   50 mg at 07/13/17 2240   Or  . diphenhydrAMINE (BENADRYL) injection 50 mg  50 mg Intramuscular Q6H  PRN Pucilowska, Jolanta B, MD      . divalproex (DEPAKOTE ER) 24 hr tablet 750 mg  750 mg Oral QHS Darliss RidgelKapur, Aarti K, MD   750 mg at 07/20/17 2129  . haloperidol (HALDOL) tablet 10 mg  10 mg Oral Q8H PRN Pucilowska, Jolanta B, MD   10 mg at 07/13/17 2216   Or  . haloperidol lactate (HALDOL) injection 10 mg  10 mg Intramuscular Q8H PRN Pucilowska, Jolanta B, MD      . LORazepam (ATIVAN) tablet 2 mg  2 mg Oral Q6H PRN Pucilowska, Jolanta B, MD   2 mg at 07/14/17 0902   Or  . LORazepam (ATIVAN) injection 2 mg  2 mg Intramuscular Q6H PRN Pucilowska, Jolanta B, MD      . LORazepam (ATIVAN) tablet 1 mg  1 mg Oral Q4H PRN Zarin Knupp, Ileene HutchinsonHolly R, MD   1 mg at 07/17/17 1531  . LORazepam (ATIVAN) tablet 1 mg  1 mg Oral QHS Alaiza Yau, Ileene HutchinsonHolly  R, MD   1 mg at 07/20/17 2129  . magnesium hydroxide (MILK OF MAGNESIA) suspension 30 mL  30 mL Oral Daily PRN Clapacs, Jackquline DenmarkJohn T, MD        Lab Results:  Results for orders placed or performed during the hospital encounter of 07/11/17 (from the past 48 hour(s))  Valproic acid level     Status: None   Collection Time: 07/21/17  7:42 AM  Result Value Ref Range   Valproic Acid Lvl 56 50.0 - 100.0 ug/mL    Blood Alcohol level:  Lab Results  Component Value Date   ETH <10 07/11/2017    Metabolic Disorder Labs: Lab Results  Component Value Date   HGBA1C 5.4 07/12/2017   MPG 108.28 07/12/2017   No results found for: PROLACTIN Lab Results  Component Value Date   CHOL 132 07/12/2017   TRIG 40 07/12/2017   HDL 54 07/12/2017   CHOLHDL 2.4 07/12/2017   VLDL 8 07/12/2017   LDLCALC 70 07/12/2017    Physical Findings: AIMS:  , ,  ,  ,    CIWA:  CIWA-Ar Total: 0 COWS:     Musculoskeletal: Strength & Muscle Tone: within normal limits Gait & Station: normal Patient leans: N/A  Psychiatric Specialty Exam: Physical Exam  Nursing note and vitals reviewed.   Review of Systems  All other systems reviewed and are negative.   Blood pressure 119/74, pulse 81, temperature 97.8 F (36.6 C), temperature source Oral, resp. rate 16, height 5\' 8"  (1.727 m), weight 74.8 kg (165 lb), SpO2 100 %.Body mass index is 25.09 kg/m.  General Appearance: Casual  Eye Contact:  Good  Speech:  Clear and Coherent  Volume:  Normal  Mood:  Euthymic  Affect:  Congruent  Thought Process:  Coherent  Orientation:  Full (Time, Place, and Person)  Thought Content:  Negative  Suicidal Thoughts:  No  Homicidal Thoughts:  No  Memory:  Immediate;   Fair  Judgement:  Fair  Insight:  Fair  Psychomotor Activity:  Normal  Concentration:  Concentration: Fair  Recall:  [employer]  Fund of Knowledge:  Fair  Language:  Fair  Akathisia:  No      Assets:  Communication Skills Desire for Improvement  ADL's:  Intact   Cognition:  WNL  Sleep:  Number of Hours: 6.15     Treatment Plan Summary: 21 yo male admitted for manic episode. Pt has shown significant progress and improvement in symptom. He is tolerating medications well and  plans to continue these on discharge. Plan for discharge tomorrow.  Bipolar I disorder -Continue Abilify 20 mg daily -Continue Depakote 750 mg qhs -d/c Ativan  Dispo -Plan to d/c tomorrow back to [place].  Haskell Riling, MD 07/21/2017, 3:28 PM

## 2017-07-21 NOTE — BHH Group Notes (Signed)
Goals Group Date/Time: 07/21/2017 9:00 AM Type of Therapy and Topic: Group Therapy: Goals Group: SMART Goals   Participation Level: Moderate  Description of Group:    The purpose of a daily goals group is to assist and guide patients in setting recovery/wellness-related goals. The objective is to set goals as they relate to the crisis in which they were admitted. Patients will be using SMART goal modalities to set measurable goals. Characteristics of realistic goals will be discussed and patients will be assisted in setting and processing how one will reach their goal. Facilitator will also assist patients in applying interventions and coping skills learned in psycho-education groups to the SMART goal and process how one will achieve defined goal.   Therapeutic Goals:   -Patients will develop and document one goal related to or their crisis in which brought them into treatment.  -Patients will be guided by LCSW using SMART goal setting modality in how to set a measurable, attainable, realistic and time sensitive goal.  -Patients will process barriers in reaching goal.  -Patients will process interventions in how to overcome and successful in reaching goal.   Patient's Goal: to maintain my mental wellness.   Therapeutic Modalities:  Motivational Interviewing  Research officer, political partyCognitive Behavioral Therapy  Crisis Intervention Model  SMART goals setting   Daleen SquibbGreg Creg Gilmer, [place]

## 2017-07-22 MED ORDER — ARIPIPRAZOLE 20 MG PO TABS: 20.0000 mg | ORAL_TABLET | Freq: Every day | ORAL | 1 refills | Status: DC

## 2017-07-22 MED ORDER — DIVALPROEX SODIUM ER 250 MG PO TB24: 750.0000 mg | ORAL_TABLET | Freq: Every day | ORAL | 1 refills | Status: DC

## 2017-07-22 NOTE — Progress Notes (Addendum)
  [facility] :  Will you be returning to the same living situation after discharge:  Yes,  return to campus At discharge, do you have transportation home?: Yes,  advisor Melvin Drake approx 12 Noon Do you have the ability to pay for your medications: Yes,  no concerns expressed  Release of information consent forms completed and turned in to Medical Records  Patient to Follow up at: Follow-up Information    [facility] Follow up on 08/04/2017.   Specialty:  Behavioral Health Why:  Initial appointment to begin medication managment with Dr. Daleen Boavi on 08/04/2017 11:00am Contact information: [address] [facility] [place] [id number] [phone]       [facility]. Go on 07/25/2017.   Why:  10:30am with Case manager Melvin Drake to determine next steps/frequency of services etc. Contact information: [address] [place] [address] [phone] Fax          Next level of care provider [facility]:yes  Safety Planning and Suicide Prevention discussed: Yes,  reviewed w patient, 3 attempts w mother Melvin Drake  Have you used any form of tobacco in the last 30 days? (Cigarettes, Smokeless Tobacco, Cigars, and/or Pipes): Yes  Has patient been referred to the Quitline?: Patient refused referral  Patient has been referred for addiction treatment: N/A, can be reassessed in outpatient evaluation  Melvin Langenne C Cunningham, LCSW 07/22/2017, 8:53 AM

## 2017-07-22 NOTE — Progress Notes (Signed)
Recreation Therapy Notes  INPATIENT RECREATION TR PLAN  Patient Details Name: Melvin Drake MRN: [id number] DOB: 26-Aug-1996 Today's Date: 07/22/2017  Rec Therapy Plan Is patient appropriate for Therapeutic Recreation?: Yes Treatment times per week: at least 3 Estimated Length of Stay: 5-7 days TR Treatment/Interventions: Group participation (Comment)(Appropriate participation in recreation therapy tx.)  Discharge Criteria Pt will be discharged from therapy if:: Discharged Treatment plan/goals/alternatives discussed and agreed upon by:: Patient/family  Discharge Summary Short term goals set: Patient will identify 3 triggers to mood instability x5 days.  Short term goals met: Adequate for discharge Progress toward goals comments: Groups attended Which groups?: Anger management, Other (Comment), Goal setting, Communication, Stress management(Life planning, creative expression) Reason goals not met: N/A Therapeutic equipment acquired: N/A Reason patient discharged from therapy: Discharge from hospital Pt/family agrees with progress & goals achieved: Yes Date patient discharged from therapy: 07/22/17   Ankita Newcomer 07/22/2017, 2:43 PM

## 2017-07-22 NOTE — BHH Suicide Risk Assessment (Signed)
[facility]   Principal Problem: Bipolar 1 disorder, manic, moderate (HCC) Discharge Diagnoses:  Patient Active Problem List   Diagnosis Date Noted  . Bipolar 1 disorder, manic, moderate (HCC) [F31.12] 07/11/2017    Total Time spent with patient: 20 minutes  Musculoskeletal: Strength & Muscle Tone: within normal limits Gait & Station: normal Patient leans: N/A  Psychiatric Specialty Exam: ROS  Blood pressure (!) 134/93, pulse (!) 102, temperature 98 F (36.7 C), temperature source Oral, resp. rate 18, height 5\' 8"  (1.727 m), weight 74.8 kg (165 lb), SpO2 99 %.Body mass index is 25.09 kg/m.      Demographic Factors:  Male and Living alone  Loss Factors: NA  Historical Factors: NA  Risk Reduction Factors:   Positive social support, Positive therapeutic relationship and Positive coping skills or problem solving skills  Continued Clinical Symptoms:  None  Cognitive Features That Contribute To Risk:  None    Suicide Risk:  Minimal: No identifiable suicidal ideation.  Patients presenting with no risk factors but with morbid ruminations; may be classified as minimal risk based on the severity of the depressive symptoms  Follow-up Information    [facility] Follow up on 08/04/2017.   Specialty:  Behavioral Health Why:  Initial appointment to begin medication managment with Dr. Daleen Boavi on 08/04/2017 11:00am Contact information: [address] [facility] [place] [id number] [phone]       [facility]. Go on 07/25/2017.   Why:  10:30am with Case manager Ryan to determine next steps/frequency of services etc. Contact information: [address] [place] [address] [phone] Fax          Plan Of Care/Follow-up recommendations:  See above for appointments  Haskell RilingHolly R Jamarrion Budai, MD 07/22/2017, 8:51 AM

## 2017-07-22 NOTE — Discharge Summary (Signed)
Physician Discharge Summary Note  Patient:  Melvin Drake is an 21 y.o., male MRN:  [id number] DOB:  [birth year] Patient phone:  [phone] (home)  Patient address:   [address] [place] [id number],  Total Time spent with patient: 20 minutes Plus 20 minutes of medication reconciliation, discharge planning, and discharge documentation  Date of Admission:  07/11/2017 Date of Discharge: 07/22/17  Reason for Admission:  Manic episode  Principal Problem: Bipolar 1 disorder, manic, moderate (HCC) Discharge Diagnoses: Patient Active Problem List   Diagnosis Date Noted  . Bipolar 1 disorder, manic, moderate (HCC) [F31.12] 07/11/2017    Past Psychiatric History: See H&P  Past Medical History: History reviewed. No pertinent past medical history. History reviewed. No pertinent surgical history. Family History: History reviewed. No pertinent family history. Family Psychiatric  History: See H&P Social History:  Social History   Substance and Sexual Activity  Alcohol Use Yes   Comment: occas.     Social History   Substance and Sexual Activity  Drug Use Yes  . Types: Marijuana   Comment: not recently-for about 3 weeks now    Social History   Socioeconomic History  . Marital status: Single    Spouse name: None  . Number of children: None  . Years of education: None  . Highest education level: None  Social Needs  . Financial resource strain: None  . Food insecurity - worry: None  . Food insecurity - inability: None  . Transportation needs - medical: None  . Transportation needs - non-medical: None  Occupational History  . None  Tobacco Use  . Smoking status: Never Smoker  . Smokeless tobacco: Never Used  Substance and Sexual Activity  . Alcohol use: Yes    Comment: occas.  . Drug use: Yes    Types: Marijuana    Comment: not recently-for about 3 weeks now  . Sexual activity: None  Other Topics Concern  . None  Social History Narrative  . None     Hospital Course:  Pt was admitted due to acute manic episode. He was started on Abilify and titrated up to 20 mg daily. This was started due to possibility of transitioning to LAI. However, he was compliant with medications and planned to continue them so LAI was not given. Pt was later started on Depakote as an adjunct anti-manic as he was having breakthrough manic symptoms with Abilify alone. He tolerated both of these medication well with no side effects. On day of discharge, he was calm and cooperative. He was sleeping much better. Still slightly intrusive at nursing station but much improved. He was able to have a rational and linear conversation and much less tangential and hyperverbal. He had slightly better insight into the illness. He was willing to follow up as an outpatient and continue his medications. He was given 7 day supply of medications from our pharmacy nd a coupon for Abilify. Pt denied SI or thoughts of self harm. He denies HI, AH, VH. He was organized and goal directed. I was in touch frequently with his mother who is supportive of his ongoing care.   The patient is at low risk of imminent suicide. Patient denied thoughts, intent, or plan for harm to self or others, expressed significant future orientation, and expressed an ability to mobilize assistance for his/her needs. he is presently void of any contributing psychiatric symptoms, cognitive difficulties, or substance use which would elevate his/her risk for lethality. Chronic risk for lethality is elevated in  light of fair-poor insight. The chronic risk is presently mitigated by his/her ongoing desire and [facility] and mobilization of support from family and friends. Chronic risk may elevate if he experiences any significant loss or worsening of symptoms, which can be managed and monitored through outpatient providers. At this time,a cute risk for lethality is low and he is stable for ongoing outpatient management.     Musculoskeletal: Strength & Muscle Tone: within normal limits Gait & Station: normal Patient leans: N/A  Psychiatric Specialty Exam: Physical Exam  Nursing note and vitals reviewed.   Review of Systems  All other systems reviewed and are negative.   Blood pressure (!) 134/93, pulse (!) 102, temperature 98 F (36.7 C), temperature source Oral, resp. rate 18, height 5\' 8"  (1.727 m), weight 74.8 kg (165 lb), SpO2 99 %.Body mass index is 25.09 kg/m.  General Appearance: Fairly Groomed  Eye Contact:  Good  Speech:  Clear and Coherent  Volume:  Normal  Mood:  Euthymic  Affect:  Congruent  Thought Process:  Goal Directed  Orientation:  Full (Time, Place, and Person)  Thought Content:  Negative  Suicidal Thoughts:  No  Homicidal Thoughts:  No  Memory:  Immediate;   Fair  Judgement:  Fair  Insight:  Lacking  Psychomotor Activity:  Normal  Concentration:  Concentration: Fair  Recall:  Fair  Fund of Knowledge:  Fair  Language:  Fair  Akathisia:  No      Assets:  Communication Skills Desire for Improvement Social Support  ADL's:  Intact  Cognition:  WNL  Sleep:  Number of Hours: 6.15     Have you used any form of tobacco in the last 30 days? (Cigarettes, Smokeless Tobacco, Cigars, and/or Pipes): Yes  Has this patient used any form of tobacco in the last 30 days? (Cigarettes, Smokeless Tobacco, Cigars, and/or Pipes) Yes, Yes, A prescription for an FDA-approved tobacco cessation medication was offered at discharge and the patient refused  Blood Alcohol level:  Lab Results  Component Value Date   [facility] 07/11/2017    Metabolic Disorder Labs:  Lab Results  Component Value Date   HGBA1C 5.4 07/12/2017   MPG 108.28 07/12/2017   No results found for: PROLACTIN Lab Results  Component Value Date   CHOL 132 07/12/2017   TRIG 40 07/12/2017   HDL 54 07/12/2017   CHOLHDL 2.4 07/12/2017   VLDL 8 07/12/2017   LDLCALC 70 07/12/2017    See Psychiatric Specialty Exam  and Suicide Risk Assessment completed by Attending Physician prior to discharge.  Discharge destination:  Home  Is patient on multiple antipsychotic therapies at discharge:  No   Has Patient had three or more failed trials of antipsychotic monotherapy by history:  No  Recommended Plan for Multiple Antipsychotic Therapies: NA  Discharge Instructions    Increase activity slowly   Complete by:  As directed      Allergies as of 07/22/2017   No Known Allergies     Medication List    TAKE these medications     Indication  ARIPiprazole 20 MG tablet Commonly known as:  ABILIFY Take 1 tablet (20 mg total) daily by mouth.  Indication:  Manic Phase of Manic-Depression   divalproex 250 MG 24 hr tablet Commonly known as:  DEPAKOTE ER Take 3 tablets (750 mg total) at bedtime by mouth.  Indication:  Manic Phase of Manic-Depression      Follow-up Information    [facility] Follow  up on 08/04/2017.   Specialty:  Behavioral Health Why:  Initial appointment to begin medication managment with Dr. Daleen Bo on 08/04/2017 11:00am Contact information: [address] [facility] [place] [address] [phone]       [facility]. Go on 07/25/2017.   Why:  10:30am with Case manager Ryan to determine next steps/frequency of services etc. Contact information: [address] [employer] [address] [place] [address] [phone] Fax          Follow-up recommendations: See above for follow up appointments  Signed: Haskell Riling, MD 07/22/2017, 8:55 AM

## 2017-07-22 NOTE — Plan of Care (Signed)
Patient slept for Estimated Hours of 6.15; Precautionary checks every 15 minutes for safety maintained, room free of safety hazards, patient sustains no injury or falls during this shift.  

## 2017-07-22 NOTE — Progress Notes (Signed)
CH spoke briefly with patient as they crossed in the hallway. Patient would like to discuss things as he is being discharged in a couple days and would like emotional support.

## 2017-07-22 NOTE — Progress Notes (Signed)
[facility] as we were walking and talking around the unit. Patients were talking about religion, life, and getting out of hospital. Patient [facility] with him before his discharge.

## 2017-07-22 NOTE — Progress Notes (Signed)
Patient denies SI/HI, denies A/V hallucinations. Patient verbalizes understanding of discharge instructions, follow up care and prescriptions.7 days medicines given to patient. Patient given all belongings from  locker. Patient escorted out by staff, transported [facility].

## 2017-07-22 NOTE — BHH Group Notes (Signed)
BHH LCSW Group Therapy Note  Date/Time: 07/22/17, 0930  Type of Therapy and Topic:  Group Therapy:  Feelings around Relapse and Recovery  Participation Level:  Active   Mood:pleasant  Description of Group:    Patients in this group will discuss emotions they experience before and after a relapse. They will process how experiencing these feelings, or avoidance of experiencing them, relates to having a relapse. Facilitator will guide patients to explore emotions they have related to recovery. Patients will be encouraged to process which emotions are more powerful. They will be guided to discuss the emotional reaction significant others in their lives may have to patients' relapse or recovery. Patients will be assisted in exploring ways to respond to the emotions of others without this contributing to a relapse.  Therapeutic Goals: 1. Patient will identify two or more emotions that lead to relapse for them:  2. Patient will identify two emotions that result when they relapse:  3. Patient will identify two emotions related to recovery:  4. Patient will demonstrate ability to communicate their needs through discussion and/or role plays.   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  Pt was appropriate in his sharing, participated in group discussion about addiction, shared that anger and sadness are emotions that are difficult for him. At the end of group, asked to be allowed to draw something on the board and put "God" in the middle of a diagram and talked about God being support to people.     Therapeutic Modalities:   Cognitive Behavioral Therapy Solution-Focused Therapy Assertiveness Training Relapse Prevention Therapy  Daleen SquibbGreg Aniaya Bacha, LCSW

## 2017-08-04 ENCOUNTER — Ambulatory Visit: Payer: Self-pay | Admitting: Psychiatry

## 2018-09-16 IMAGING — DX DG HAND COMPLETE 3+V*R*
3 series · 3 of 3 positions shown · non-contrast
Comparison: None.

CLINICAL DATA: Pain at the base of the thumb and radial side of the
wrist after injury.

EXAM:
RIGHT HAND - COMPLETE 3+ VIEW

[hand ap]
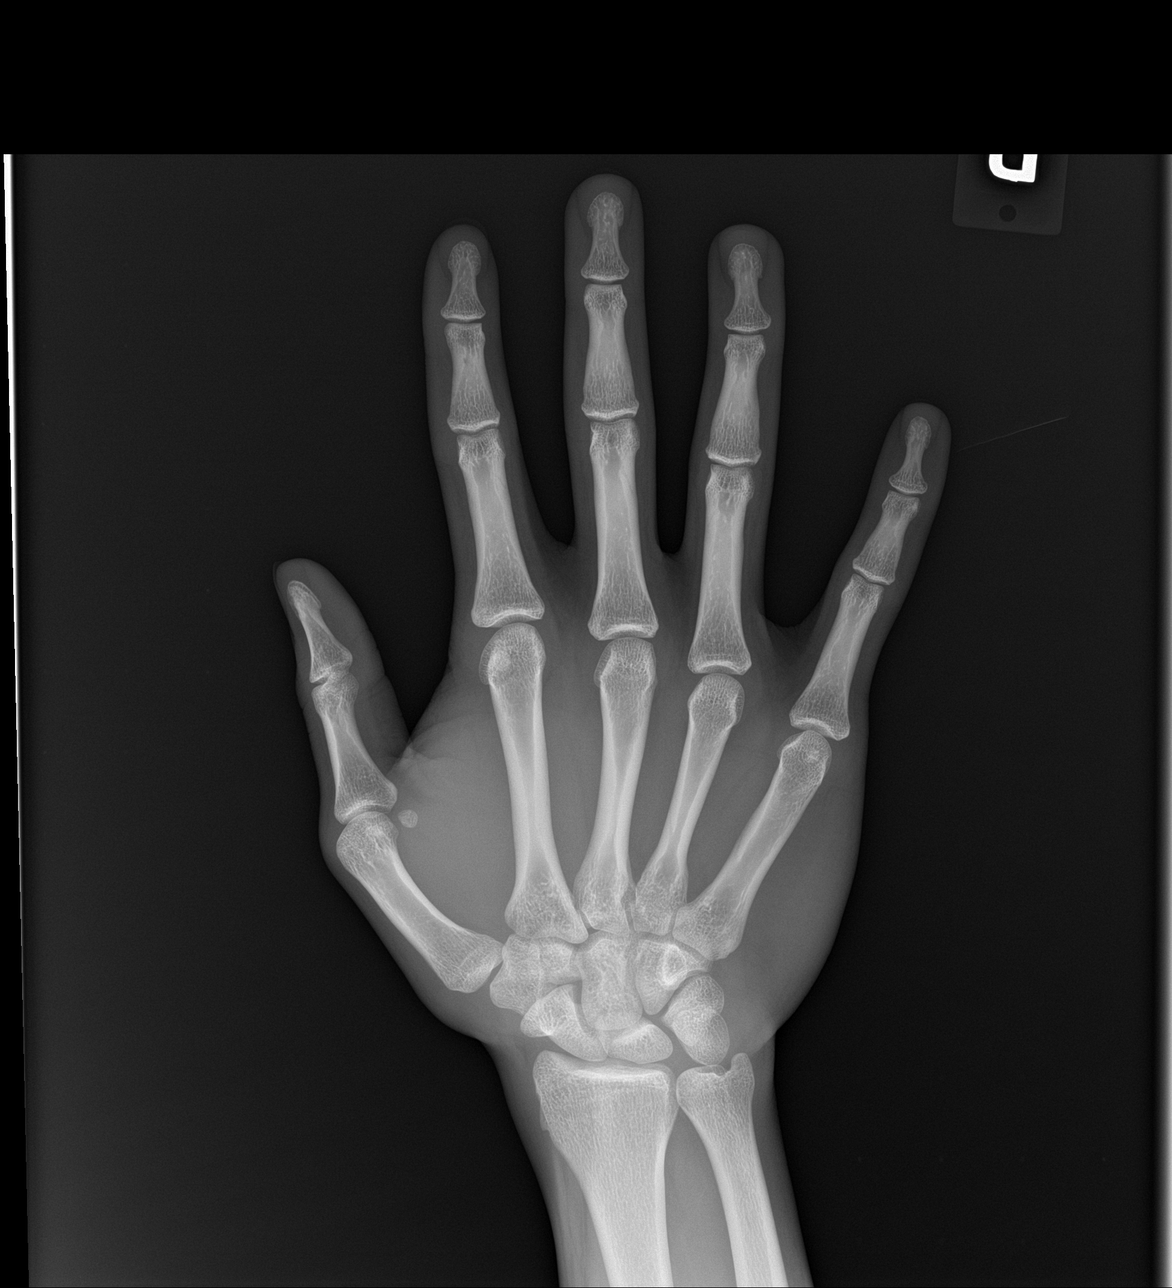

[hand obl]
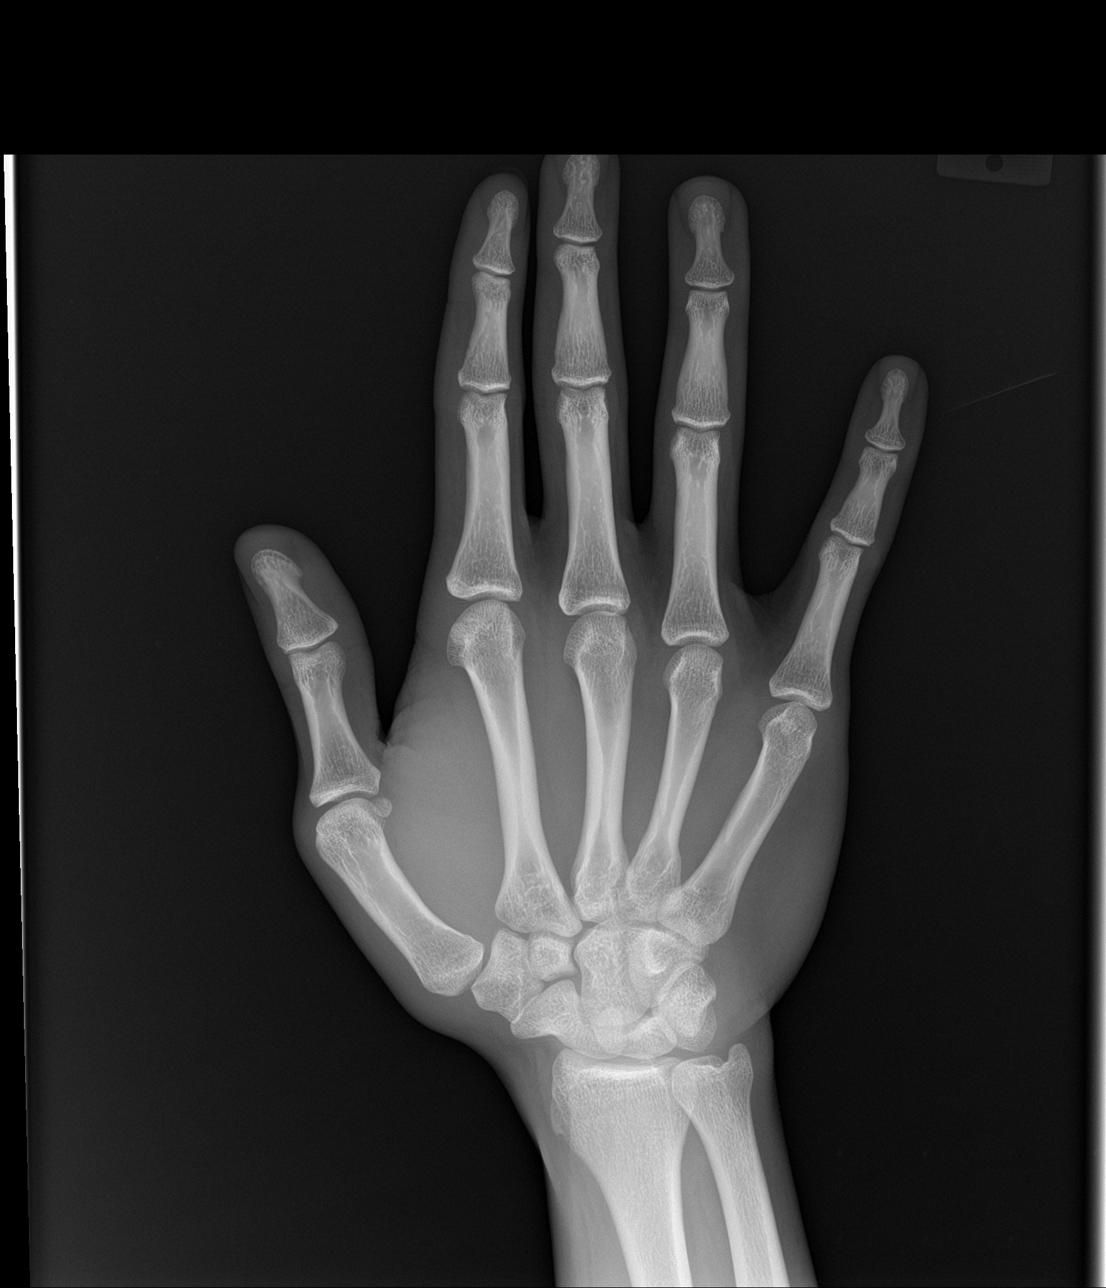

[hand lat]
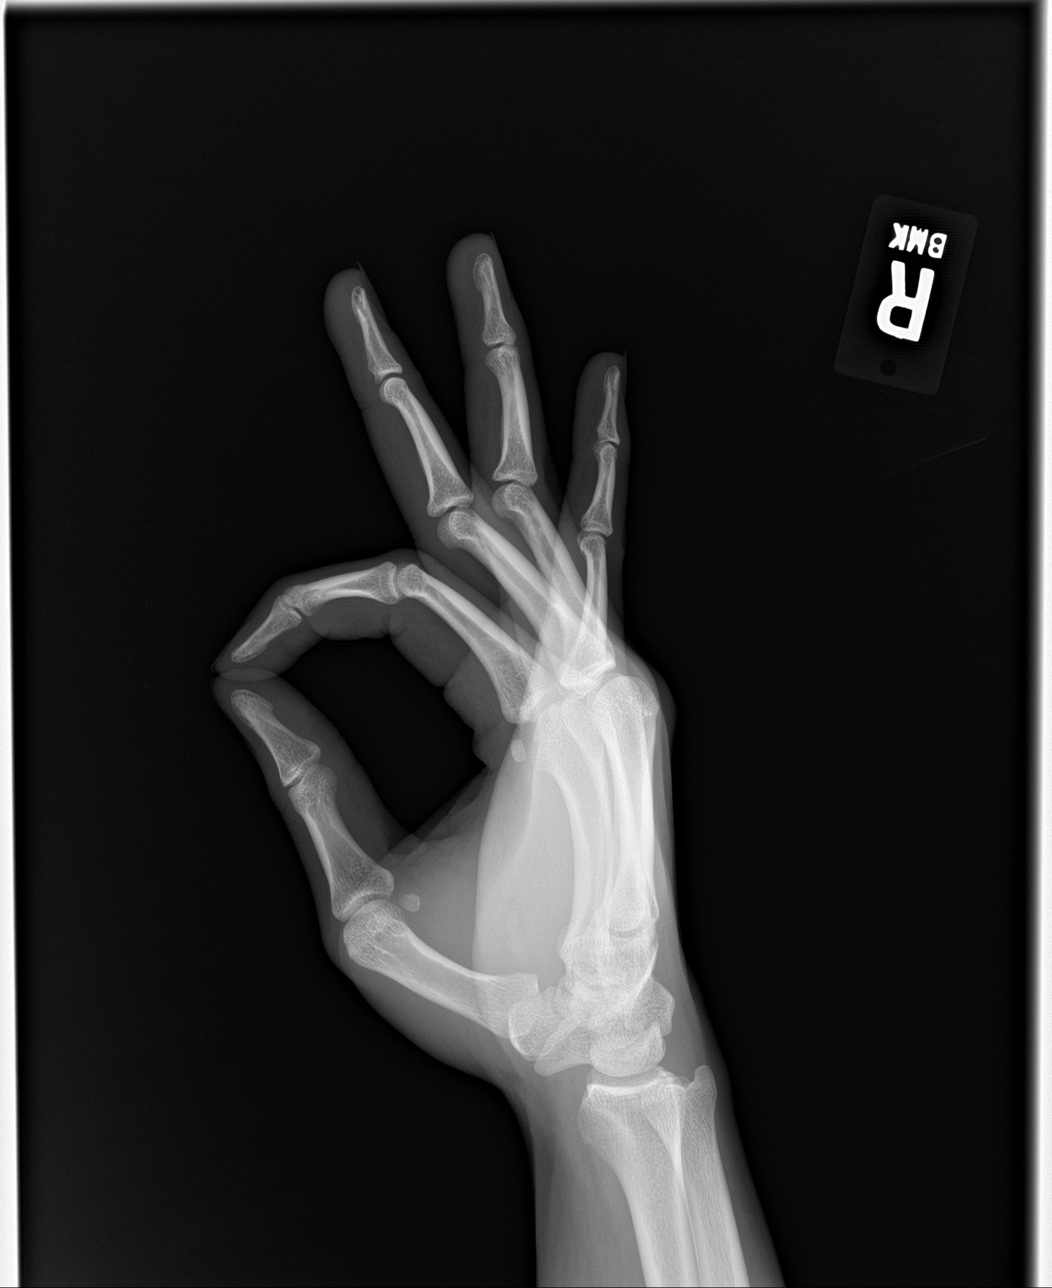

[3 of 3 positions shown; findings below may reference images not displayed]

FINDINGS: There is no evidence of fracture or dislocation. There is no
evidence of arthropathy or other focal bone abnormality. Soft
tissues are unremarkable.
IMPRESSION: Negative.

## 2019-02-26 ENCOUNTER — Emergency Department
Admission: EM | Admit: 2019-02-26 | Discharge: 2019-02-27 | Disposition: A | Discharge status: Home / Self Care | Payer: Federal, State, Local not specified - PPO | Attending: Emergency Medicine | Admitting: Emergency Medicine

## 2019-02-26 ENCOUNTER — Other Ambulatory Visit: Payer: Self-pay

## 2019-02-26 ENCOUNTER — Inpatient Hospital Stay: Admission: AD | Admit: 2019-02-26 | Payer: Federal, State, Local not specified - PPO | Admitting: Psychiatry

## 2019-02-26 DIAGNOSIS — F1210 Cannabis abuse, uncomplicated: Secondary | ICD-10-CM | POA: Diagnosis not present

## 2019-02-26 DIAGNOSIS — R451 Restlessness and agitation: Secondary | ICD-10-CM | POA: Diagnosis not present

## 2019-02-26 DIAGNOSIS — Z20828 Contact with and (suspected) exposure to other viral communicable diseases: Secondary | ICD-10-CM | POA: Diagnosis not present

## 2019-02-26 DIAGNOSIS — F3112 Bipolar disorder, current episode manic without psychotic features, moderate: Secondary | ICD-10-CM

## 2019-02-26 DIAGNOSIS — R45850 Homicidal ideations: Secondary | ICD-10-CM

## 2019-02-26 DIAGNOSIS — Z046 Encounter for general psychiatric examination, requested by authority: Secondary | ICD-10-CM | POA: Diagnosis present

## 2019-02-26 DIAGNOSIS — F309 Manic episode, unspecified: Secondary | ICD-10-CM

## 2019-02-26 LAB — URINE DRUG SCREEN, QUALITATIVE (ARMC ONLY)
Amphetamines, Ur Screen: NOT DETECTED
Barbiturates, Ur Screen: NOT DETECTED
Benzodiazepine, Ur Scrn: POSITIVE — AB
Cannabinoid 50 Ng, Ur ~~LOC~~: NOT DETECTED
Cocaine Metabolite,Ur ~~LOC~~: NOT DETECTED
MDMA (Ecstasy)Ur Screen: NOT DETECTED
Methadone Scn, Ur: NOT DETECTED
Opiate, Ur Screen: NOT DETECTED
Phencyclidine (PCP) Ur S: NOT DETECTED
Tricyclic, Ur Screen: NOT DETECTED

## 2019-02-26 LAB — COMPREHENSIVE METABOLIC PANEL
ALT: 50 U/L — ABNORMAL HIGH (ref 0–44)
AST: 54 U/L — ABNORMAL HIGH (ref 15–41)
Albumin: 5.2 g/dL — ABNORMAL HIGH (ref 3.5–5.0)
Alkaline Phosphatase: 92 U/L (ref 38–126)
Anion gap: 9 (ref 5–15)
BUN: 16 mg/dL (ref 6–20)
CO2: 25 mmol/L (ref 22–32)
Calcium: 9.5 mg/dL (ref 8.9–10.3)
Chloride: 107 mmol/L (ref 98–111)
Creatinine, Ser: 1.08 mg/dL (ref 0.61–1.24)
GFR calc Af Amer: >60 mL/min (ref >60)
GFR calc non Af Amer: >60 mL/min (ref >60)
Glucose, Bld: 121 mg/dL — ABNORMAL HIGH (ref 70–99)
Potassium: 4.1 mmol/L (ref 3.5–5.1)
Sodium: 141 mmol/L (ref 135–145)
Total Bilirubin: 0.7 mg/dL (ref 0.3–1.2)
Total Protein: 8 g/dL (ref 6.5–8.1)

## 2019-02-26 LAB — ETHANOL: Alcohol, Ethyl (B): <10 mg/dL (ref <10)

## 2019-02-26 LAB — CBC
HCT: 42.7 % (ref 39.0–52.0)
Hemoglobin: 14.3 g/dL (ref 13.0–17.0)
MCH: 30 pg (ref 26.0–34.0)
MCHC: 33.5 g/dL (ref 30.0–36.0)
MCV: 89.7 fL (ref 80.0–100.0)
Platelets: 278 10*3/uL (ref 150–400)
RBC: 4.76 MIL/uL (ref 4.22–5.81)
RDW: 12.3 % (ref 11.5–15.5)
WBC: 9.6 10*3/uL (ref 4.0–10.5)
nRBC: 0 % (ref 0.0–0.2)

## 2019-02-26 LAB — SARS CORONAVIRUS 2 BY RT PCR (HOSPITAL ORDER, PERFORMED IN ~~LOC~~ HOSPITAL LAB): SARS Coronavirus 2: NEGATIVE

## 2019-02-26 LAB — SALICYLATE LEVEL: Salicylate Lvl: <7 mg/dL (ref 2.8–30.0)

## 2019-02-26 LAB — ACETAMINOPHEN LEVEL: Acetaminophen (Tylenol), Serum: <10 ug/mL — ABNORMAL LOW (ref 10–30)

## 2019-02-26 MED ORDER — OLANZAPINE 10 MG IM SOLR: 10.0000 mg | Freq: Two times a day (BID) | INTRAMUSCULAR | Status: DC | PRN

## 2019-02-26 MED ORDER — DIPHENHYDRAMINE HCL 25 MG PO CAPS: 50.0000 mg | ORAL_CAPSULE | Freq: Two times a day (BID) | ORAL | Status: DC | PRN

## 2019-02-26 MED ORDER — OLANZAPINE 5 MG PO TABS
5.0000 mg | ORAL_TABLET | Freq: Two times a day (BID) | ORAL | Status: DC
  Administered 2019-02-26 – 2019-02-27 (×2): 5 mg via ORAL
  Filled 2019-02-26 (×2): qty 1

## 2019-02-26 MED ORDER — OLANZAPINE 10 MG IM SOLR: 5.0000 mg | Freq: Two times a day (BID) | INTRAMUSCULAR | Status: DC

## 2019-02-26 MED ORDER — OLANZAPINE 10 MG PO TABS: 10.0000 mg | ORAL_TABLET | Freq: Every day | ORAL | Status: DC

## 2019-02-26 MED ORDER — LORAZEPAM 2 MG PO TABS
2.0000 mg | ORAL_TABLET | Freq: Once | ORAL | Status: AC
  Administered 2019-02-26: 2 mg via ORAL
  Filled 2019-02-26: qty 1

## 2019-02-26 MED ORDER — OLANZAPINE 10 MG PO TABS: 10.0000 mg | ORAL_TABLET | Freq: Two times a day (BID) | ORAL | Status: DC | PRN

## 2019-02-26 MED ORDER — OLANZAPINE 10 MG PO TABS
10.0000 mg | ORAL_TABLET | Freq: Once | ORAL | Status: AC
  Administered 2019-02-26: 10 mg via ORAL
  Filled 2019-02-26: qty 1

## 2019-02-26 MED ORDER — DIVALPROEX SODIUM 500 MG PO DR TAB
500.0000 mg | DELAYED_RELEASE_TABLET | Freq: Two times a day (BID) | ORAL | Status: DC
  Filled 2019-02-26 (×2): qty 1

## 2019-02-26 MED ORDER — DIPHENHYDRAMINE HCL 50 MG/ML IJ SOLN: 50.0000 mg | Freq: Two times a day (BID) | INTRAMUSCULAR | Status: DC | PRN

## 2019-02-26 NOTE — BH Assessment (Signed)
Patient is to be admitted to [facility] by Waylan Boga, NP.  Attending Physician will be Dr. Weber Cooks.   Patient has been assigned to room 314, by [facility] Charge Nurse T'Yawn.   Intake Paper Work has been signed and placed on patient chart.  ER staff is aware of the admission:  Lattie Haw, ER Secretary    Dr. Myrene Buddy, ER MD   Abigail Butts, Patient's Nurse   Butch Penny, Patient Access.

## 2019-02-26 NOTE — Progress Notes (Signed)
This Probation officer was in the process of getting report from Amy, RN at 1208, when I was informed that there was some confusion about patient being admitted. This Probation officer was informed that MD wants to observe patient and may be possibly admitted to the unit at a later time.

## 2019-02-26 NOTE — Progress Notes (Signed)
Patient presented to the ED with mania with the inability to even stand still.  He was given oral Zyprexa and responded well to it but later became irritable and manic again.  Melvin Drake has been sleeping and resting most of the day.  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  His brother wanted to take him now but voiced understanding that he was not stable and has seen him like this three other times.  He drove down with a friend to assist and have a hotel in place already for tonight.  His brother stated he probably has not slept since he was discharged from [facility] and feels he needs it.  Discussed the plan for him to return to the hospital tomorrow (he and his mother are Melvin Drake's POA) to see if he is stable enough to return home where he has a psychiatrist, psychiatric hospital, and other resources they are familiar with.  His mother would have come with him but she has cancer and does not want to be in the hospital area related to the risk.  Discussed the plan with the patient for him to stay and take medications, hopefully sleep, reevaluate in the morning.  He was agreeable and stated the medications that work for him are Ativan and Trazodone, does not like Depakote. Explained we would use the Zyprexa as it worked well for him and helped his anxiety.  Hopefully, he will sleep and be closer to his baseline in the morning.  Waylan Boga, PMHNP

## 2019-02-26 NOTE — ED Provider Notes (Signed)
[facility] Emergency Department Provider Note  ____________________________________________  Time seen: Approximately 6:13 AM  I have reviewed the triage vital signs and the nursing notes.   HISTORY  Chief Complaint Psychiatric Evaluation   HPI Melvin Drake is a 23 y.o. male with a history of bipolar who presents for manic episode.  Patient reports compliance with his medications.  Says that since the death of Angelena Sole he has been feeling very agitated and aggressive towards others.  He has been having thoughts of hurting other people and killing other people.  No one in specific.  No suicidal ideation.  Patient reports that he feels that his mania has been out of control over the last few days.  Has had racing thoughts, difficulty sleeping.  He reports daily alcohol use.  Denies any other drug use.   Patient Active Problem List   Diagnosis Date Noted  . Bipolar 1 disorder, manic, moderate ([place]) 07/11/2017    No past surgical history on file.  Prior to Admission medications   Medication Sig Start Date End Date Taking? Authorizing Provider  ARIPiprazole (ABILIFY) 20 MG tablet Take 1 tablet (20 mg total) daily by mouth. 07/22/17   McNew, Tyson Babinski, MD  divalproex (DEPAKOTE ER) 250 MG 24 hr tablet Take 3 tablets (750 mg total) at bedtime by mouth. 07/22/17   McNew, Tyson Babinski, MD    Allergies Patient has no known allergies.  No family history on file.  Social History Social History   Tobacco Use  . Smoking status: Never Smoker  . Smokeless tobacco: Never Used  Substance Use Topics  . Alcohol use: Yes    Comment: occas.  . Drug use: Yes    Types: Marijuana    Comment: not recently-for about 3 weeks now    Review of Systems  Constitutional: Negative for fever. Eyes: Negative for visual changes. ENT: Negative for sore throat. Neck: No neck pain  Cardiovascular: Negative for chest pain. Respiratory: Negative for shortness of breath.  Gastrointestinal: Negative for abdominal pain, vomiting or diarrhea. Genitourinary: Negative for dysuria. Musculoskeletal: Negative for back pain. Skin: Negative for rash. Neurological: Negative for headaches, weakness or numbness. Psych: No S. + HI and mania   ____________________________________________   PHYSICAL EXAM:  VITAL SIGNS: ED Triage Vitals  Enc Vitals Group     BP 02/26/19 0101 (!) 135/98     Pulse Rate 02/26/19 0101 91     Resp 02/26/19 0101 18     Temp 02/26/19 0101 98.8 F (37.1 C)     Temp Source 02/26/19 0101 Oral     SpO2 02/26/19 0101 97 %     Weight 02/26/19 0102 186 lb (84.4 kg)     Height 02/26/19 0102 5\' 7"  (1.702 m)     Head Circumference --      Peak Flow --      Pain Score 02/26/19 0102 0     Pain Loc --      Pain Edu? --      Excl. in [place]? --     Constitutional: Alert and oriented, no apparent distress. HEENT:      Head: Normocephalic and atraumatic.         Eyes: Conjunctivae are normal. Sclera is non-icteric.       Mouth/Throat: Mucous membranes are moist.       Neck: Supple with no signs of meningismus. Cardiovascular: Regular rate and rhythm. No murmurs, gallops, or rubs.  Respiratory: Normal respiratory effort. Lungs  are clear to auscultation bilaterally. No wheezes, crackles, or rhonchi.  Gastrointestinal: Soft, non tender, and non distended with positive bowel sounds. No rebound or guarding. Musculoskeletal: No edema, cyanosis, or erythema of extremities. Neurologic: Normal speech and language. Face is symmetric. Moving all extremities. No gross focal neurologic deficits are appreciated. Skin: Skin is warm, dry and intact. No rash noted. Psychiatric: Mood and affect are elevated, racing thoughts, + HI ____________________________________________   LABS (all labs ordered are listed, but only abnormal results are displayed)  Labs Reviewed  COMPREHENSIVE METABOLIC PANEL - Abnormal; Notable for the following components:      Result  Value   Glucose, Bld 121 (*)    Albumin 5.2 (*)    AST 54 (*)    ALT 50 (*)    All other components within normal limits  ACETAMINOPHEN LEVEL - Abnormal; Notable for the following components:   Acetaminophen (Tylenol), Serum <10 (*)    All other components within normal limits  URINE DRUG SCREEN, QUALITATIVE ([facility] ONLY) - Abnormal; Notable for the following components:   Benzodiazepine, Ur Scrn POSITIVE (*)    All other components within normal limits  ETHANOL  SALICYLATE LEVEL  CBC   ____________________________________________  EKG  none  ____________________________________________  RADIOLOGY  none  ____________________________________________   PROCEDURES  Procedure(s) performed: None Procedures Critical Care performed:  None ____________________________________________   INITIAL IMPRESSION / ASSESSMENT AND PLAN / ED COURSE   23 y.o. male with a history of bipolar who presents for manic episode and + HI.  Patient under IVC.  Labs for medical clearance with no acute findings.  Patient awaiting psychiatric evaluation.      As part of my medical decision making, I reviewed the following data within the electronic MEDICAL RECORD NUMBER Nursing notes reviewed and incorporated, Labs reviewed , Old chart reviewed, A consult was requested and obtained from this/these consultant(s) Psychiatry, Notes from prior ED visits and [place] Controlled Substance Database    Pertinent labs & imaging results that were available during my care of the patient were reviewed by me and considered in my medical decision making (see chart for details).    ____________________________________________   FINAL CLINICAL IMPRESSION(S) / ED DIAGNOSES  Final diagnoses:  Manic episode (HCC)      NEW MEDICATIONS STARTED DURING THIS VISIT:  ED Discharge Orders    None       Note:  This document was prepared using Dragon voice recognition software and may include unintentional dictation  errors.    Don PerkingVeronese, [place], MD 02/26/19 [phone]

## 2019-02-26 NOTE — ED Notes (Signed)
Pt. Transferred from Triage to room 19 hall after dressing out and screening for contraband. Pt. Oriented to Quad including Q15 minute rounds as well as Engineer, drilling for their protection. Patient is alert and oriented, warm and dry in no acute distress. Patient denies SI, HI, and AVH. Pt. Encouraged to let me know if needs arise.

## 2019-02-26 NOTE — ED Triage Notes (Signed)
Pt is here with IVC papers with [facility] police. Pt states he is manic. 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 Pt very talkative, difficult to stay on subject.

## 2019-02-26 NOTE — ED Notes (Signed)
Patient's brother Merrily Pew [phone]

## 2019-02-26 NOTE — Progress Notes (Signed)
Subjective/Objective No new subjective & objective note has been filed under this hospital service since the last note was generated.   Scheduled Meds: . divalproex  500 mg Oral Q12H   Continuous Infusions: PRN Meds:diphenhydrAMINE **OR** diphenhydrAMINE, OLANZapine **OR** OLANZapine  Vital signs in last 24 hours: Temp:  [98.8 F (37.1 C)] 98.8 F (37.1 C) (06/22 0101) Pulse Rate:  [91] 91 (06/22 0101) Resp:  [18] 18 (06/22 0101) BP: (135)/(98) 135/98 (06/22 0101) SpO2:  [97 %] 97 % (06/22 0101) Weight:  [84.4 kg] 84.4 kg (06/22 0102)  Intake/Output last 3 shifts: No intake/output data recorded. Intake/Output this shift: No intake/output data recorded.  Problem Assessment/Plan No new Assessment & Plan notes have been filed under this hospital service since the last note was generated. Service: Psychiatry

## 2019-02-26 NOTE — ED Notes (Signed)
Pt initially did not want to take Zyprexa. RN reminded patient compliance with medication would better his chances of being discharged tomorrow. Pt then agreed to take the Zyprexa, but refused Depakote.

## 2019-02-26 NOTE — ED Notes (Signed)
Walking covid swab to lab. 

## 2019-02-26 NOTE — ED Notes (Signed)
Patient talking to Officers, telling them that he wants to run and that he feels that he could get past the Officers. Hyper religious one sided conversation taking place between him and anybody listening.

## 2019-02-26 NOTE — ED Notes (Signed)
Patient transferred room 19h to [facility], reported to Amy RN, Patient willingly ambulated to unit, no signs of distress.

## 2019-02-26 NOTE — ED Notes (Addendum)
Pt dressed out by this tech and [place], [place]. Pt has a silver cross necklace, yellow stress ball, blue bractlet, blue vans, red nike shorts, black nike shirt, black and green face mask, black hat, white socks, gray underwear and white under shirt, elon ID and Red cellphone. Pt stated that they did not have their wallet on them
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
## 2019-02-26 NOTE — ED Notes (Signed)
Hourly rounding reveals patient in hall bed talking to Officers. No complaints, stable, in no acute distress. Q15 minute rounds and monitoring via Engineer, drilling to continue.

## 2019-02-26 NOTE — ED Notes (Signed)
Hourly rounding reveals patient in hall bed. No complaints, stable, in no acute distress. Q15 minute rounds and monitoring via Rover and Officer to continue.  

## 2019-02-26 NOTE — BH Assessment (Addendum)
Assessment Note  Melvin Drake is an 23 y.o. male who presents to ED with manic behaviors. IVC papers state he was threatening other students at [employer] but pt states 'I had no intention of acting on them". Pt very talkative, difficult to stay on subject. Pt states the police picked him up from [facility] and transported him to the ED. 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 Pt reports having suicidal thoughts last month where he stated "I wished I wasn't here". Pt denied past suicidal attempts. Pt denied past/current self-harming behaviors. He reports recent alcohol use; however, he denied use of other substances. Pt is linked with [facility] for outpatient mental health treatment. He receives medication management (NP provider) and therapy with this provider. When pt was asked why these services were initially started, he stated "because I have fucking Bipolar". Pt appeared to become somewhat agitated toward the end of assessment. This Probation officer thanked pt for his time and concluded assessment.  Diagnosis: Bipolar I Disorder, by history  Past Medical History: No past medical history on file.  No past surgical history on file.  Family History: No family history on file.  Social History:  reports that he has never smoked. He has never used smokeless tobacco. He reports current alcohol use. He reports current drug use. Drug: Marijuana.  Additional Social History:  Alcohol / Drug Use Pain Medications: See MAR Prescriptions: See MAR Over the Counter: See MAR History of alcohol / drug use?: Yes Longest period of sobriety (when/how long): UKN Negative Consequences of Use: Financial Withdrawal Symptoms: (None Reported) Substance #1 Name of Substance 1: Alcohol 1 - Age of First Use: UKN 1 - Amount (size/oz): "1 shot of tequila" 1 - Frequency: Unable to Quantify 1 - Duration: Unable to  Quantify 1 - Last Use / Amount: Yesterday  CIWA: CIWA-Ar BP: (!) 135/98 Pulse Rate: 91 COWS:    Allergies: No Known Allergies  Home Medications: (Not in a hospital admission)   OB/GYN Status:  No LMP for male patient.  General Assessment Data Location of Assessment: [facility] ED TTS Assessment: In system Is this a Tele or Face-to-Face Assessment?: Face-to-Face Is this an Initial Assessment or a Re-assessment for this encounter?: Initial Assessment Patient Accompanied by:: N/A Language Other than English: No Living Arrangements: Other (Comment)(Private Residence) What gender do you identify as?: Male Marital status: Single Maiden name: N/A Living Arrangements: [facility]) Can pt return to current living arrangement?: Yes Admission Status: Involuntary Petitioner: ED Attending Is patient capable of signing voluntary admission?: No Referral Source: Self/Family/Friend Insurance type: [place] Screening Exam ([place]) Medical Exam completed: Yes  Crisis Care Plan Living Arrangements: [facility]) Legal Guardian: Other:(Self) Name of Psychiatrist: [facility](NP - provider) Name of Therapist: [employer] Status Is patient currently in school?: No Is the patient employed, 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 : Yes Suicidal Intent: No Has patient had any suicidal intent within the past 6 months prior to admission? : Yes Is patient at risk for suicide?: No Suicidal Plan?: No Has patient had any suicidal plan within the past 6 months prior to admission? : No Access to Means: No What has been your use of drugs/alcohol within the  last 12 months?: Alcohol Previous Attempts/Gestures: No How many times?: 0 Other Self Harm Risks: None Triggers for Past Attempts: None  known Intentional Self Injurious Behavior: None Family Suicide History: Unknown Recent stressful life event(s): Other (Comment), Trauma (Comment)(Current Events (News)) Persecutory voices/beliefs?: No Depression: Yes Depression Symptoms: Feeling angry/irritable Substance abuse history and/or treatment for substance abuse?: Yes Suicide prevention information given to non-admitted patients: Not applicable  Risk to Others within the past 6 months Homicidal Ideation: Yes-Currently Present Does patient have any lifetime risk of violence toward others beyond the six months prior to admission? : Yes (comment) Thoughts of Harm to Others: Yes-Currently Present Comment - Thoughts of Harm to Others: Thoughts  Current Homicidal Intent: Yes-Currently Present Current Homicidal Plan: Yes-Currently Present Describe Current Homicidal Plan: No specified plan Access to Homicidal Means: Otho Bellows(UKN) Identified Victim: None History of harm to others?: No Assessment of Violence: None Noted Violent Behavior Description: None Does patient have access to weapons?: No Criminal Charges Pending?: No Does patient have a court date: No Is patient on probation?: No  Psychosis Hallucinations: None noted Delusions: None noted  Mental Status Report Appearance/Hygiene: In scrubs Eye Contact: Fair Motor Activity: Freedom of movement Speech: Logical/coherent Level of Consciousness: Alert Mood: Anxious, Irritable Affect: Irritable Anxiety Level: Minimal Thought Processes: Coherent, Relevant Judgement: Partial Orientation: Person, Place, Time, Situation Obsessive Compulsive Thoughts/Behaviors: Moderate  Cognitive Functioning Concentration: Normal Memory: Recent Intact, Remote Intact Is patient IDD: No Insight: Poor Impulse Control: Poor Appetite: Good Have you had any weight changes? : No Change Sleep: No Change Total Hours of Sleep: 0 Vegetative Symptoms: None  ADLScreening [facility] Assessment  Services) Patient's cognitive ability adequate to safely complete daily activities?: Yes Patient able to express need for assistance with ADLs?: Yes Independently performs ADLs?: Yes (appropriate for developmental age)  Prior Inpatient Therapy Prior Inpatient Therapy: Yes Prior Therapy Dates: [facility]N [facility] Reason for Treatment: Bipolar 1 Disorder  Prior Outpatient Therapy Prior Outpatient Therapy: Yes Prior Therapy Dates: Current [facility] [address] Reason for Treatment: Bipolar D/O Does patient have an ACCT team?: No Does patient have Intensive In-House Services?  : No Does patient have [facility] services? : No Does patient have P4CC services?: No  ADL Screening (condition at time of admission) Patient's cognitive ability adequate to safely complete daily activities?: Yes Patient able to express need for assistance with ADLs?: Yes Independently performs ADLs?: Yes (appropriate for developmental age)       Abuse/Neglect Assessment (Assessment to be complete while patient is alone) Abuse/Neglect Assessment Can Be Completed: Yes Physical Abuse: Denies Verbal Abuse: Denies Sexual Abuse: Denies Exploitation of patient/patient's resources: Denies Self-Neglect: Denies Values / Beliefs Cultural Requests During Hospitalization: None Spiritual Requests During Hospitalization: None Consults Spiritual Care Consult Needed: No Social Work Consult Needed: No         Child/Adolescent Assessment Running Away Risk: (Patient is an adult)  Disposition:  Disposition Initial Assessment Completed for this Encounter: Yes Disposition of Patient: (Pending disposition) Patient refused recommended treatment: No Mode of transportation if patient is discharged/movement?: N/A  On Site Evaluation by:   Reviewed with Physician:    Wilmon ArmsSTEVENSON,  02/26/2019 4:24 PM

## 2019-02-26 NOTE — ED Notes (Signed)
Patient talked with Dr. Dietrich Pates on the computer in consult room, no signs of distress.

## 2019-02-26 NOTE — ED Notes (Addendum)
Pt's brother here requesting an update on patient. This Probation officer did not give out any information. RN has the brother's contact number so patient can call if he chooses.  Pt currently sleeping (previously agitated and given PO medication).

## 2019-02-26 NOTE — ED Notes (Signed)
Patient is talkative, talks about race often, but without any behavioral issues, He is cooperative, and easily redirected, will continue to monitor.

## 2019-02-26 NOTE — ED Notes (Signed)
Patient ate 100% of breakfast and beverage.  
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
## 2019-02-27 MED ORDER — LAMOTRIGINE 100 MG PO TABS
50.0000 mg | ORAL_TABLET | Freq: Once | ORAL | Status: AC
  Administered 2019-02-27: 50 mg via ORAL
  Filled 2019-02-27: qty 1

## 2019-02-27 MED ORDER — LORAZEPAM 0.5 MG PO TABS
0.5000 mg | ORAL_TABLET | Freq: Once | ORAL | Status: AC
  Administered 2019-02-27: 0.5 mg via ORAL
  Filled 2019-02-27: qty 1

## 2019-02-27 NOTE — ED Notes (Signed)
[facility] PAPERS  RESCINDED  PER DR RAVI  MD  INFORMED  RN AMY

## 2019-02-27 NOTE — ED Notes (Signed)
Melvin Drake has been coming to the door quite often to ask the time.  Says he likes to keep a routine and normally exercises outside in the sun at 7am.  I told him he could exercise.  He appears anxious.  I offered that he could take a shower now and he will do that.  I gave him supplies for that and he went in shower.  I had to explain that he will not be locked in and demonstrated with the door handle.  I explained that I may knock to make sure he is okay, but he just needs to answer that he is.

## 2019-02-27 NOTE — ED Notes (Signed)
Patient talking to brother on phone--he is pacing back and forth and asks me to move if I get in the path he needs to pace in.  I told him he had medication to take.  When told it is zyprexa he states  That he does not consent to take that medication.  Says he is talking to his family right now.

## 2019-02-27 NOTE — ED Notes (Signed)
Pt discharged home with family. VS stable. All belongings returned to patient. Pt denies SI/HI. Discharge paperwork reviewed with patient. Pt signed for discharge.

## 2019-02-27 NOTE — ED Notes (Signed)
Pt slept approx 7.5 hours since 2100 last night.

## 2019-02-27 NOTE — ED Notes (Signed)
Pt is awake now after sleeping most of the night. He asked if he could walk and do push-ups, and he was calm and understanding when encouraged to stay in his room.

## 2019-02-27 NOTE — ED Notes (Signed)
Pt approached nurses' station to ask to shower. Explained that showers aren't conducted at night, so he requested and was given a change of scrubs, washcloth, and toothpaste/toothbrush. Pt returned toiletries after use and returned to bed.

## 2019-02-27 NOTE — ED Provider Notes (Signed)
-----------------------------------------   6:22 AM on 02/27/2019 -----------------------------------------   Blood pressure 126/74, pulse 68, temperature 97.6 F (36.4 C), temperature source Oral, resp. rate 18, height 5\' 7"  (1.702 m), weight 84.4 kg, SpO2 100 %.  The patient is sleeping at this time.  There have been no acute events since the last update.  Awaiting disposition plan from Behavioral Medicine team.   Paulette Blanch, MD 02/27/19 [phone]

## 2019-02-27 NOTE — BH Assessment (Signed)
TTS spoke to pt's brother Byrd Rushlow: [phone]) who states he will arrive at 12:15pm to p/u pt from ED.  This information has been relayed to pt's nurse.

## 2019-02-27 NOTE — ED Provider Notes (Signed)
Cleared for d/c by Dr. Einar Grad of psychiatry   Lavonia Drafts, MD 02/27/19 1044

## 2022-03-24 ENCOUNTER — Ambulatory Visit: Payer: Federal, State, Local not specified - PPO | Admitting: Adult Health

## 2022-03-24 ENCOUNTER — Encounter: Payer: Self-pay | Admitting: Adult Health

## 2022-03-24 VITALS — BP 130/80 | HR 102 | Temp 97.8°F | Ht 67.5 in | Wt 237.8 lb

## 2022-03-24 DIAGNOSIS — Z7184 Encounter for health counseling related to travel: Secondary | ICD-10-CM

## 2022-03-24 MED ORDER — YELLOW FEVER VACCINE ~~LOC~~ INJ: 0.5000 mL | INJECTION | Freq: Once | SUBCUTANEOUS | 0 refills | Status: AC

## 2022-03-24 NOTE — Progress Notes (Signed)
[facility] [address] [place], [place] [address] Phone: [phone] Fax: [phone]   Office Visit Note  Patient Name: Melvin Drake  Date of [id number]  Med Rec number [id number]  Date of Service: 03/24/2022  Chlorpromazine  Chief Complaint  Patient presents with   travel medication    Going to [place] in Jan 2024. Needs RX for yellow fever     HPI  Patient is here for Yellow fever vaccine as he is traveling to [place] for January term (2024).  He denies any other needs or issues at this time.     Current Medication:  Outpatient Encounter Medications as of 03/24/2022  Medication Sig   divalproex (DEPAKOTE) 250 MG DR tablet Take 250 mg by mouth 2 (two) times daily.   LamoTRIgine 50 MG TB24 24 hour tablet Take 50 mg by mouth daily.   risperiDONE (RISPERDAL) 2 MG tablet Take 2 mg by mouth every evening.    yellow fever (YF-VAX) injection Inject 0.5 mLs into the skin once for 1 dose.   No facility-administered encounter medications on file as of 03/24/2022.      Medical History: No past medical history on file.   Vital Signs: BP 130/80   Pulse (!) 102   Temp 97.8 F (36.6 C) (Tympanic)   Ht 5' 7.5" (1.715 m)   Wt 237 lb 12.8 oz (107.9 kg)   SpO2 98%   BMI 36.70 kg/m    Review of Systems  Constitutional:  Negative for fatigue and fever.  All other systems reviewed and are negative.   Physical Exam Vitals and nursing note reviewed.  Constitutional:      Appearance: Normal appearance.  Neurological:     Mental Status: He is alert.    Assessment/Plan: 1. Counseling for travel RX sent to [facility].  - yellow fever (YF-VAX) injection; Inject 0.5 mLs into the skin once for 1 dose.  Dispense: 1 each; Refill: 0     General Counseling: Melvin Drake verbalizes understanding of the findings of todays visit and agrees with plan of treatment. I have discussed any further diagnostic evaluation that may be needed or ordered today. We also reviewed his  medications today. he has been encouraged to call the office with any questions or concerns that should arise related to todays visit.   No orders of the defined types were placed in this encounter.   Meds ordered this encounter  Medications   yellow fever (YF-VAX) injection    Sig: Inject 0.5 mLs into the skin once for 1 dose.    Dispense:  1 each    Refill:  0    Time spent:25 Minutes    Johnna Acosta AGNP-C Nurse Practitioner

## 2022-07-09 ENCOUNTER — Ambulatory Visit (INDEPENDENT_AMBULATORY_CARE_PROVIDER_SITE_OTHER): Payer: Federal, State, Local not specified - PPO | Admitting: Family Medicine

## 2022-07-09 ENCOUNTER — Other Ambulatory Visit: Payer: Self-pay

## 2022-07-09 ENCOUNTER — Encounter: Payer: Self-pay | Admitting: Family Medicine

## 2022-07-09 VITALS — BP 120/82 | HR 83 | Temp 98.0°F | Wt 248.0 lb

## 2022-07-09 DIAGNOSIS — J339 Nasal polyp, unspecified: Secondary | ICD-10-CM | POA: Diagnosis not present

## 2022-07-09 DIAGNOSIS — Z87898 Personal history of other specified conditions: Secondary | ICD-10-CM | POA: Diagnosis not present

## 2022-07-09 DIAGNOSIS — J0110 Acute frontal sinusitis, unspecified: Secondary | ICD-10-CM

## 2022-07-09 DIAGNOSIS — F309 Manic episode, unspecified: Secondary | ICD-10-CM

## 2022-07-09 MED ORDER — FLUTICASONE PROPIONATE 50 MCG/ACT NA SUSP: 2.0000 | Freq: Two times a day (BID) | NASAL | 6 refills | Status: AC

## 2022-07-09 MED ORDER — AMOXICILLIN-POT CLAVULANATE 875-125 MG PO TABS: 1.0000 | ORAL_TABLET | Freq: Two times a day (BID) | ORAL | 0 refills | Status: DC

## 2022-07-09 NOTE — Progress Notes (Signed)
[facility] [address] [place], [place] [address] Phone: [phone] Fax: [phone]   Office Visit Note  Patient Name: Melvin Drake  Date of [id number]  Med Rec number [id number]  Date of Service: 07/09/2022  Chlorpromazine  Chief Complaint  Patient presents with   sick     Has been feeling unwell for about a week with sore throat - getting worse, pain when swallowing, No cough,  No known fever Runny nose and stuffy nose No meds so far  PMH -Has Bipolar 1 disorder, Chronic stuffy nose, snores    Current Medication:  Outpatient Encounter Medications as of 07/09/2022  Medication Sig   traZODone (DESYREL) 50 MG tablet Take 50 mg by mouth daily.   divalproex (DEPAKOTE) 250 MG DR tablet Take 250 mg by mouth 2 (two) times daily.   LamoTRIgine 50 MG TB24 24 hour tablet Take 50 mg by mouth daily.   risperiDONE (RISPERDAL) 2 MG tablet Take 2 mg by mouth every evening.    No facility-administered encounter medications on file as of 07/09/2022.    Medical History: History reviewed. No pertinent past medical history.   Vital Signs: BP 120/82   Pulse 83   Temp 98 F (36.7 C) (Tympanic)   Wt 248 lb (112.5 kg)   SpO2 98%   BMI 38.27 kg/m    Review of Systems  Constitutional:  Positive for fatigue.  HENT:  Positive for congestion and rhinorrhea. Negative for sinus pain.   Respiratory:  Negative for cough.   Hematological:  Negative for adenopathy.    Physical Exam Vitals reviewed.  Constitutional:      General: He is not in acute distress.    Appearance: He is not ill-appearing.  HENT:     Right Ear: Tympanic membrane is scarred. Tympanic membrane is not injected, erythematous or bulging.     Left Ear: Tympanic membrane is scarred. Tympanic membrane is not injected, erythematous or bulging.     Nose: Congestion and rhinorrhea present.     Right Turbinates: Swollen.     Left Turbinates: Swollen.     Right Sinus: No maxillary sinus  tenderness or frontal sinus tenderness.     Left Sinus: Frontal sinus tenderness present. No maxillary sinus tenderness.     Comments: Right nasal polyp    Mouth/Throat:     Mouth: Mucous membranes are moist.     Pharynx: Uvula midline. No oropharyngeal exudate or posterior oropharyngeal erythema.  Eyes:     Conjunctiva/sclera: Conjunctivae normal.  Pulmonary:     Effort: Pulmonary effort is normal.     Breath sounds: Normal breath sounds and air entry.  Lymphadenopathy:     Cervical: No cervical adenopathy.  Neurological:     Mental Status: He is alert.    Assessment/Plan:  1. Acute non-recurrent frontal sinusitis  - amoxicillin-clavulanate (AUGMENTIN) 875-125 MG tablet; Take 1 tablet by mouth 2 (two) times daily.  Dispense: 20 tablet; Refill: 0  2. Nasal polyp, unspecified Should use fluticasone nasal spray for 8 weeks - if no improvement by then will refer to ENT for complete diagnosis and further management - fluticasone (FLONASE) 50 MCG/ACT nasal spray; Place 2 sprays into both nostrils in the morning and at bedtime.  Dispense: 16 g; Refill: 6  3. History of snoring Suspect due to combination of nasal passage obstruction and neck circumference If no improvement with use of nasal spary will refer to ENT     General Counseling: [place] verbalizes understanding of the  findings of todays visit and agrees with plan of treatment. I have discussed any further diagnostic evaluation that may be needed or ordered today. We also reviewed his medications today. he has been encouraged to call the office with any questions or concerns that should arise related to todays visit.   No orders of the defined types were placed in this encounter.   No orders of the defined types were placed in this encounter.   Dr Durwin Reges Cassey Hurrell ABFM University Physician

## 2022-10-26 ENCOUNTER — Other Ambulatory Visit: Payer: Self-pay

## 2022-10-26 ENCOUNTER — Encounter: Payer: Self-pay | Admitting: Oncology

## 2022-10-26 ENCOUNTER — Ambulatory Visit (INDEPENDENT_AMBULATORY_CARE_PROVIDER_SITE_OTHER): Payer: Federal, State, Local not specified - PPO | Admitting: Oncology

## 2022-10-26 VITALS — BP 122/74 | HR 78 | Temp 98.5°F | Resp 18 | Ht 67.5 in | Wt 246.0 lb

## 2022-10-26 DIAGNOSIS — R0981 Nasal congestion: Secondary | ICD-10-CM

## 2022-10-26 LAB — POC SOFIA 2 FLU + SARS ANTIGEN FIA
Influenza A, POC: NEGATIVE
Influenza B, POC: NEGATIVE
SARS Coronavirus 2 Ag: NEGATIVE

## 2022-10-26 NOTE — Progress Notes (Signed)
[address] [place], [place] [address] Phone: [phone] Fax: [phone]   Office Visit Note  Patient Name: Melvin Drake  Date of [id number]  Med Rec number [id number]  Date of Service: 10/26/2022  Chlorpromazine  Chief Complaint  Patient presents with   Cough   Patient is an 27 y.o. student here for complaints of congestion, chills, weak, body aches and cough X 3 days. Yesterday was the worst. Has been taking zicam, flonase, dayquil and ibuprofen that helped some. No sick contacts. Stopped taking vitamins and thinks that lowered his immune system. Has had several viral illnesses over the past few months. Laughing makes symptoms worse.   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   Current Medication:  Outpatient Encounter Medications as of 10/26/2022  Medication Sig   divalproex (DEPAKOTE) 250 MG DR tablet Take 250 mg by mouth 2 (two) times daily.   fluticasone (FLONASE) 50 MCG/ACT nasal spray Place 2 sprays into both nostrils in the morning and at bedtime.   LamoTRIgine 50 MG TB24 24 hour tablet Take 50 mg by mouth daily.   risperiDONE (RISPERDAL) 2 MG tablet Take 2 mg by mouth every evening.    traZODone (DESYREL) 50 MG tablet Take 50 mg by mouth daily.   [DISCONTINUED] amoxicillin-clavulanate (AUGMENTIN) 875-125 MG tablet Take 1 tablet by mouth 2 (two) times daily.   No facility-administered encounter medications on file as of 10/26/2022.   Medical History: History reviewed. No pertinent past medical history.  Vital Signs: BP 122/74   Pulse 78   Temp 98.5 F (36.9 C) (Tympanic)   Resp 18   Ht 5' 7.5" (1.715 m)   Wt 246 lb (111.6 kg)   SpO2 99%   BMI 37.96 kg/m   ROS: As per HPI.  All other pertinent ROS negative.     Review of Systems  Constitutional:  Positive for chills, diaphoresis and fatigue.  HENT:  Positive for congestion, sinus pressure, sinus pain and sore throat.   Respiratory:  Negative for cough.    Gastrointestinal:  Negative for diarrhea, nausea and vomiting.  Musculoskeletal:  Positive for myalgias.  Neurological:  Negative for dizziness and headaches.    Physical Exam Constitutional:      Appearance: Normal appearance.  HENT:     Right Ear: Tympanic membrane normal.     Left Ear: Tympanic membrane normal.     Nose: Congestion and rhinorrhea present.     Right Turbinates: Swollen.     Left Turbinates: Swollen.     Right Sinus: Maxillary sinus tenderness present. No frontal sinus tenderness.     Left Sinus: Maxillary sinus tenderness present. No frontal sinus tenderness.     Mouth/Throat:     Lips: Pink.     Mouth: Mucous membranes are moist.     Pharynx: Oropharynx is clear. Posterior oropharyngeal erythema present.  Neurological:     Mental Status: He is alert.     No results found for this or any previous visit (from the past 24 hour(s)).  Assessment/Plan: 1. Nasal congestion -Flu/Covid negative.  - Clinically suspicious for flu.  Sx onset > 48 hours so unfortunately unable to recommend tamiflu. Encourage supportive measures (fluids, rest cough drops, tea) and over the counter meds (I.e. analgesic/anti-inflammatory, decongestant, antihistamine, cough suppressant)  as needed for symptom relief.  Discussed infection control measures to prevent spread of infection.  Advised she should not return to class until afebrile (100.4 or above) for 24 hours.  Advised patient to  send me a message in MyChart if continue to have fever.  Return to clinic as needed for new/worsening symptoms (I.e. shortness of breath) or if symptoms are not resolving as expected over the next 5-7 days.    - POC SOFIA 2 FLU + SARS ANTIGEN FIA  Disposition-RTC PRN   General Counseling: Melvin Drake verbalizes understanding of the findings of todays visit and agrees with plan of treatment. I have discussed any further diagnostic evaluation that may be needed or ordered today. We also reviewed his medications  today. he has been encouraged to call the office with any questions or concerns that should arise related to todays visit.   No orders of the defined types were placed in this encounter.   No orders of the defined types were placed in this encounter.   I spent 20 minutes dedicated to the care of this patient (face-to-face and non-face-to-face) on the date of the encounter to include what is described in the assessment and plan.   Faythe Casa, NP 10/26/2022 8:30 AM

## 2022-10-29 ENCOUNTER — Ambulatory Visit: Payer: Federal, State, Local not specified - PPO | Admitting: Medical
